# Patient Record
Sex: Female | Born: 1940 | Race: White | Hispanic: No | Marital: Married | Smoking: Never smoker
Health system: Southern US, Community
[De-identification: ages and names within clinical notes are randomized; demographics above are authoritative.]

## PROBLEM LIST (undated history)

## (undated) DIAGNOSIS — I10 Essential (primary) hypertension: Secondary | ICD-10-CM

## (undated) DIAGNOSIS — E119 Type 2 diabetes mellitus without complications: Secondary | ICD-10-CM

## (undated) HISTORY — PX: TUBAL LIGATION: SHX77

## (undated) HISTORY — PX: TRACHEOSTOMY: SUR1362

## (undated) HISTORY — PX: CATARACT EXTRACTION: SUR2

---

## 2019-10-02 ENCOUNTER — Encounter (HOSPITAL_COMMUNITY): Payer: Self-pay | Admitting: Emergency Medicine

## 2019-10-02 ENCOUNTER — Emergency Department (HOSPITAL_COMMUNITY): Payer: Medicare Other

## 2019-10-02 ENCOUNTER — Inpatient Hospital Stay (HOSPITAL_COMMUNITY)
Admission: EM | Admit: 2019-10-02 | Discharge: 2019-10-05 | DRG: 522 | Disposition: A | Discharge status: Skilled Nursing Facility | Payer: Medicare Other | Attending: Internal Medicine | Admitting: Internal Medicine

## 2019-10-02 ENCOUNTER — Other Ambulatory Visit: Payer: Self-pay

## 2019-10-02 DIAGNOSIS — W1830XA Fall on same level, unspecified, initial encounter: Secondary | ICD-10-CM | POA: Diagnosis present

## 2019-10-02 DIAGNOSIS — E1122 Type 2 diabetes mellitus with diabetic chronic kidney disease: Secondary | ICD-10-CM | POA: Diagnosis present

## 2019-10-02 DIAGNOSIS — W19XXXA Unspecified fall, initial encounter: Secondary | ICD-10-CM

## 2019-10-02 DIAGNOSIS — I129 Hypertensive chronic kidney disease with stage 1 through stage 4 chronic kidney disease, or unspecified chronic kidney disease: Secondary | ICD-10-CM | POA: Diagnosis present

## 2019-10-02 DIAGNOSIS — S72009A Fracture of unspecified part of neck of unspecified femur, initial encounter for closed fracture: Secondary | ICD-10-CM | POA: Diagnosis present

## 2019-10-02 DIAGNOSIS — N179 Acute kidney failure, unspecified: Secondary | ICD-10-CM | POA: Diagnosis present

## 2019-10-02 DIAGNOSIS — Y92013 Bedroom of single-family (private) house as the place of occurrence of the external cause: Secondary | ICD-10-CM

## 2019-10-02 DIAGNOSIS — I10 Essential (primary) hypertension: Secondary | ICD-10-CM | POA: Diagnosis present

## 2019-10-02 DIAGNOSIS — D62 Acute posthemorrhagic anemia: Secondary | ICD-10-CM | POA: Diagnosis present

## 2019-10-02 DIAGNOSIS — Z20822 Contact with and (suspected) exposure to covid-19: Secondary | ICD-10-CM | POA: Diagnosis present

## 2019-10-02 DIAGNOSIS — S0990XA Unspecified injury of head, initial encounter: Secondary | ICD-10-CM | POA: Diagnosis present

## 2019-10-02 DIAGNOSIS — G8929 Other chronic pain: Secondary | ICD-10-CM | POA: Diagnosis present

## 2019-10-02 DIAGNOSIS — E1321 Other specified diabetes mellitus with diabetic nephropathy: Secondary | ICD-10-CM

## 2019-10-02 DIAGNOSIS — S72002A Fracture of unspecified part of neck of left femur, initial encounter for closed fracture: Secondary | ICD-10-CM | POA: Diagnosis present

## 2019-10-02 DIAGNOSIS — R339 Retention of urine, unspecified: Secondary | ICD-10-CM | POA: Diagnosis not present

## 2019-10-02 DIAGNOSIS — M549 Dorsalgia, unspecified: Secondary | ICD-10-CM | POA: Diagnosis present

## 2019-10-02 DIAGNOSIS — N183 Chronic kidney disease, stage 3 unspecified: Secondary | ICD-10-CM | POA: Diagnosis present

## 2019-10-02 DIAGNOSIS — Z23 Encounter for immunization: Secondary | ICD-10-CM | POA: Diagnosis present

## 2019-10-02 DIAGNOSIS — Z96642 Presence of left artificial hip joint: Secondary | ICD-10-CM

## 2019-10-02 HISTORY — DX: Essential (primary) hypertension: I10

## 2019-10-02 HISTORY — DX: Type 2 diabetes mellitus without complications: E11.9

## 2019-10-02 LAB — COMPREHENSIVE METABOLIC PANEL
ALT: 15 U/L (ref 0–44)
AST: 18 U/L (ref 15–41)
Albumin: 4.2 g/dL (ref 3.5–5.0)
Alkaline Phosphatase: 58 U/L (ref 38–126)
Anion gap: 11 (ref 5–15)
BUN: 39 mg/dL — ABNORMAL HIGH (ref 8–23)
CO2: 20 mmol/L — ABNORMAL LOW (ref 22–32)
Calcium: 9.1 mg/dL (ref 8.9–10.3)
Chloride: 104 mmol/L (ref 98–111)
Creatinine, Ser: 1.87 mg/dL — ABNORMAL HIGH (ref 0.44–1.00)
GFR calc Af Amer: 30 mL/min — ABNORMAL LOW (ref >60)
GFR calc non Af Amer: 25 mL/min — ABNORMAL LOW (ref >60)
Glucose, Bld: 236 mg/dL — ABNORMAL HIGH (ref 70–99)
Potassium: 4.3 mmol/L (ref 3.5–5.1)
Sodium: 135 mmol/L (ref 135–145)
Total Bilirubin: 0.6 mg/dL (ref 0.3–1.2)
Total Protein: 8.3 g/dL — ABNORMAL HIGH (ref 6.5–8.1)

## 2019-10-02 LAB — CBC WITH DIFFERENTIAL/PLATELET
Abs Immature Granulocytes: 0.05 10*3/uL (ref 0.00–0.07)
Basophils Absolute: 0.1 10*3/uL (ref 0.0–0.1)
Basophils Relative: 1 %
Eosinophils Absolute: 0.2 10*3/uL (ref 0.0–0.5)
Eosinophils Relative: 2 %
HCT: 37.8 % (ref 36.0–46.0)
Hemoglobin: 11.9 g/dL — ABNORMAL LOW (ref 12.0–15.0)
Immature Granulocytes: 0 %
Lymphocytes Relative: 10 %
Lymphs Abs: 1.4 10*3/uL (ref 0.7–4.0)
MCH: 25.5 pg — ABNORMAL LOW (ref 26.0–34.0)
MCHC: 31.5 g/dL (ref 30.0–36.0)
MCV: 81.1 fL (ref 80.0–100.0)
Monocytes Absolute: 0.7 10*3/uL (ref 0.1–1.0)
Monocytes Relative: 5 %
Neutro Abs: 10.8 10*3/uL — ABNORMAL HIGH (ref 1.7–7.7)
Neutrophils Relative %: 82 %
Platelets: 335 10*3/uL (ref 150–400)
RBC: 4.66 MIL/uL (ref 3.87–5.11)
RDW: 15.2 % (ref 11.5–15.5)
WBC: 13.2 10*3/uL — ABNORMAL HIGH (ref 4.0–10.5)
nRBC: 0 % (ref 0.0–0.2)

## 2019-10-02 LAB — SURGICAL PCR SCREEN
MRSA, PCR: NEGATIVE
Staphylococcus aureus: NEGATIVE

## 2019-10-02 LAB — PROTIME-INR
INR: 1 (ref 0.8–1.2)
Prothrombin Time: 12.3 seconds (ref 11.4–15.2)

## 2019-10-02 LAB — SARS CORONAVIRUS 2 BY RT PCR (HOSPITAL ORDER, PERFORMED IN ~~LOC~~ HOSPITAL LAB): SARS Coronavirus 2: NEGATIVE

## 2019-10-02 LAB — HEMOGLOBIN A1C
Hgb A1c MFr Bld: 10.1 % — ABNORMAL HIGH (ref 4.8–5.6)
Mean Plasma Glucose: 243.17 mg/dL

## 2019-10-02 LAB — GLUCOSE, CAPILLARY: Glucose-Capillary: 362 mg/dL — ABNORMAL HIGH (ref 70–99)

## 2019-10-02 LAB — ABO/RH: ABO/RH(D): A NEG

## 2019-10-02 LAB — PREPARE RBC (CROSSMATCH)

## 2019-10-02 MED ORDER — TRAZODONE HCL 50 MG PO TABS
50.0000 mg | ORAL_TABLET | Freq: Every evening | ORAL | Status: DC | PRN
  Administered 2019-10-02: 50 mg via ORAL
  Filled 2019-10-02: qty 1

## 2019-10-02 MED ORDER — ONDANSETRON HCL 4 MG/2ML IJ SOLN
4.0000 mg | Freq: Once | INTRAMUSCULAR | Status: AC
  Administered 2019-10-02: 4 mg via INTRAVENOUS
  Filled 2019-10-02: qty 2

## 2019-10-02 MED ORDER — ONDANSETRON HCL 4 MG PO TABS: 4.0000 mg | ORAL_TABLET | Freq: Four times a day (QID) | ORAL | Status: DC | PRN

## 2019-10-02 MED ORDER — AMLODIPINE BESYLATE 5 MG PO TABS
10.0000 mg | ORAL_TABLET | Freq: Every day | ORAL | Status: DC
  Administered 2019-10-02 – 2019-10-05 (×3): 10 mg via ORAL
  Filled 2019-10-02 (×5): qty 2

## 2019-10-02 MED ORDER — FENTANYL CITRATE (PF) 100 MCG/2ML IJ SOLN
50.0000 ug | INTRAMUSCULAR | Status: DC | PRN
  Administered 2019-10-02 (×3): 50 ug via INTRAVENOUS
  Filled 2019-10-02 (×3): qty 2

## 2019-10-02 MED ORDER — MUPIROCIN 2 % EX OINT
1.0000 "application " | TOPICAL_OINTMENT | Freq: Two times a day (BID) | CUTANEOUS | Status: DC
  Administered 2019-10-02 – 2019-10-05 (×7): 1 via NASAL
  Filled 2019-10-02 (×2): qty 22

## 2019-10-02 MED ORDER — ACETAMINOPHEN 325 MG PO TABS: 650.0000 mg | ORAL_TABLET | Freq: Four times a day (QID) | ORAL | Status: DC | PRN

## 2019-10-02 MED ORDER — FENTANYL CITRATE (PF) 100 MCG/2ML IJ SOLN
50.0000 ug | Freq: Once | INTRAMUSCULAR | Status: AC
  Administered 2019-10-02: 50 ug via INTRAVENOUS
  Filled 2019-10-02: qty 2

## 2019-10-02 MED ORDER — SODIUM CHLORIDE 0.9 % IV SOLN: INTRAVENOUS | Status: DC

## 2019-10-02 MED ORDER — INSULIN ASPART 100 UNIT/ML ~~LOC~~ SOLN
0.0000 [IU] | Freq: Every day | SUBCUTANEOUS | Status: DC
  Administered 2019-10-02: 22:00:00 4 [IU] via SUBCUTANEOUS
  Administered 2019-10-04 – 2019-10-05 (×2): 2 [IU] via SUBCUTANEOUS

## 2019-10-02 MED ORDER — CHLORHEXIDINE GLUCONATE 4 % EX LIQD: 60.0000 mL | Freq: Once | CUTANEOUS | Status: DC

## 2019-10-02 MED ORDER — SODIUM CHLORIDE 0.9% FLUSH: 3.0000 mL | INTRAVENOUS | Status: DC | PRN

## 2019-10-02 MED ORDER — ACETAMINOPHEN 650 MG RE SUPP: 650.0000 mg | Freq: Four times a day (QID) | RECTAL | Status: DC | PRN

## 2019-10-02 MED ORDER — POVIDONE-IODINE 10 % EX SWAB: 2.0000 "application " | Freq: Once | CUTANEOUS | Status: DC

## 2019-10-02 MED ORDER — PNEUMOCOCCAL VAC POLYVALENT 25 MCG/0.5ML IJ INJ
0.5000 mL | INJECTION | INTRAMUSCULAR | Status: AC
  Administered 2019-10-05: 0.5 mL via INTRAMUSCULAR
  Filled 2019-10-02: qty 0.5

## 2019-10-02 MED ORDER — SODIUM CHLORIDE 0.9% FLUSH
3.0000 mL | Freq: Two times a day (BID) | INTRAVENOUS | Status: DC
  Administered 2019-10-03 – 2019-10-05 (×5): 3 mL via INTRAVENOUS

## 2019-10-02 MED ORDER — HEPARIN SODIUM (PORCINE) 5000 UNIT/ML IJ SOLN: 5000.0000 [IU] | Freq: Three times a day (TID) | INTRAMUSCULAR | Status: DC

## 2019-10-02 MED ORDER — SODIUM CHLORIDE 0.9% IV SOLUTION: Freq: Once | INTRAVENOUS | Status: DC

## 2019-10-02 MED ORDER — INSULIN ASPART 100 UNIT/ML ~~LOC~~ SOLN
0.0000 [IU] | Freq: Three times a day (TID) | SUBCUTANEOUS | Status: DC
  Administered 2019-10-03 – 2019-10-04 (×4): 2 [IU] via SUBCUTANEOUS

## 2019-10-02 MED ORDER — ONDANSETRON HCL 4 MG/2ML IJ SOLN
4.0000 mg | Freq: Four times a day (QID) | INTRAMUSCULAR | Status: DC | PRN
  Administered 2019-10-03: 4 mg via INTRAVENOUS
  Filled 2019-10-02: qty 2

## 2019-10-02 MED ORDER — LABETALOL HCL 5 MG/ML IV SOLN
10.0000 mg | INTRAVENOUS | Status: DC | PRN
  Administered 2019-10-03: 10 mg via INTRAVENOUS
  Filled 2019-10-02: qty 4

## 2019-10-02 MED ORDER — METOPROLOL SUCCINATE ER 50 MG PO TB24
100.0000 mg | ORAL_TABLET | Freq: Every day | ORAL | Status: DC
  Administered 2019-10-02 – 2019-10-05 (×2): 100 mg via ORAL
  Filled 2019-10-02 (×4): qty 2

## 2019-10-02 MED ORDER — SODIUM CHLORIDE 0.9 % IV SOLN: 250.0000 mL | INTRAVENOUS | Status: DC | PRN

## 2019-10-02 MED ORDER — CEFAZOLIN SODIUM-DEXTROSE 2-4 GM/100ML-% IV SOLN
2.0000 g | INTRAVENOUS | Status: AC
  Administered 2019-10-03: 2 g via INTRAVENOUS
  Filled 2019-10-02: qty 100

## 2019-10-02 MED ORDER — POLYETHYLENE GLYCOL 3350 17 G PO PACK: 17.0000 g | PACK | Freq: Every day | ORAL | Status: DC | PRN

## 2019-10-02 NOTE — H&P (View-Only) (Signed)
Patient ID: Susan Mcgee, female   DOB: 06/15/1940, 78 y.o.   MRN: [id number] PRE OP   DX LEFT FEMORAL NECK FRACTURE   Procedure: bipolar hemiarthroplasty   Consented: yes   comorbidities: HTN DM  CBC Latest Ref Rng & Units 10/02/2019  WBC 4.0 - 10.5 K/uL 13.2(H)  Hemoglobin 12.0 - 15.0 g/dL 11.9(L)  Hematocrit 36.0 - 46.0 % 37.8  Platelets 150 - 400 K/uL 335    BMP Latest Ref Rng & Units 10/02/2019  Glucose 70 - 99 mg/dL 236(H)  BUN 8 - 23 mg/dL 39(H)  Creatinine 0.44 - 1.00 mg/dL 1.87(H)  Sodium 135 - 145 mmol/L 135  Potassium 3.5 - 5.1 mmol/L 4.3  Chloride 98 - 111 mmol/L 104  CO2 22 - 32 mmol/L 20(L)  Calcium 8.9 - 10.3 mg/dL 9.1   Type and screen : yes   Type and Cross: pending      

## 2019-10-02 NOTE — Progress Notes (Signed)
CSW has reviewed patients chart and notes that patient is without primary care and/or insurance. CSW will follow up with patient regarding this matter and provide assistance as needed.   Bird Tailor M. Henri Baumler LCSWA Transitions of Care  Clinical Social Worker  Ph: [phone] 

## 2019-10-02 NOTE — NC FL2 (Signed)
  [place] MEDICAID FL2 LEVEL OF CARE SCREENING TOOL     IDENTIFICATION  Patient Name: Susan Mcgee Birthdate: 1940-06-17 Sex: female Admission Date (Current Location): 10/02/2019  [facility] and [place] Number:  [employer] and Address:  [facility],  [address], [place] [address]      Provider Number: [id number]  Attending Physician Name and Address:  Shon Hale, MD  Relative Name and Phone Number:  Rebekah Chesterfield [phone]    Current Level of Care: Hospital Recommended Level of Care: Skilled Nursing Facility Prior Approval Number:    Date Approved/Denied: 10/02/19 PASRR Number: [id number]  Discharge Plan: SNF    Current Diagnoses: Patient Active Problem List   Diagnosis Date Noted  . Hip fracture (HCC) 10/02/2019    Orientation RESPIRATION BLADDER Height & Weight     Self, Time, Situation, Place  Normal Continent Weight: 131 lb (59.4 kg) Height:  5\' 3"  (160 cm)  BEHAVIORAL SYMPTOMS/MOOD NEUROLOGICAL BOWEL NUTRITION STATUS      Continent    AMBULATORY STATUS COMMUNICATION OF NEEDS Skin   Extensive Assist Verbally Normal                       Personal Care Assistance Level of Assistance  Bathing, Feeding, Dressing Bathing Assistance: Independent Feeding assistance: Independent Dressing Assistance: Limited assistance     Functional Limitations Info  Sight, Hearing, Speech Sight Info: Adequate Hearing Info: Adequate Speech Info: Adequate    SPECIAL CARE FACTORS FREQUENCY  PT (By licensed PT), OT (By licensed OT)     PT Frequency: 3-5 x weekly OT Frequency: 3-5 x weekly            Contractures Contractures Info: Not present    Additional Factors Info  Code Status, Allergies Code Status Info: Full Allergies Info: No Known Allergies           Current Medications (10/02/2019):  This is the current hospital active medication list Current Facility-Administered Medications  Medication Dose Route  Frequency Provider Last Rate Last Admin  . fentaNYL (SUBLIMAZE) injection 50 mcg  50 mcg Intravenous Q1H PRN Long, 12/02/2019, MD   50 mcg at 10/02/19 1309  . mupirocin ointment (BACTROBAN) 2 % 1 application  1 application Nasal BID Emokpae, Courage, MD      . 12/02/19 ON 10/03/2019] pneumococcal 23 valent vaccine (PNEUMOVAX-23) injection 0.5 mL  0.5 mL Intramuscular Tomorrow-1000 Emokpae, Courage, MD         Discharge Medications: Please see discharge summary for a list of discharge medications.  Relevant Imaging Results:  Relevant Lab Results:   Additional Information 10/05/2019  Susan Mcgee [id number], LCSW

## 2019-10-02 NOTE — H&P (Signed)
Patient Demographics:    Susan Mcgee, is a 79 y.o. female  MRN: [id number]   DOB - 12-13-1940  Admit Date - 10/02/2019  Outpatient Primary MD for the patient is Patient, No Pcp Per   Assessment & Plan:    Principal Problem:   Lt Hip fracture (HCC) Active Problems:   HTN (hypertension)   Diabetes mellitus (HCC)    1)Hip fracture status post fall--- Hip x-rays with mildly displaced and impacted left femoral neck fracture, and possible fracture of the left greater trochanter -Orthopedic consult from Dr. Romeo Apple appreciated, to OR on 10/03/2019 for operative management  -2) AKI versus CKD--creatinine is 1.81, baseline renal function not available, renally adjust medications, avoid nephrotoxic agents / dehydration  / hypotension - 3)HTN--continue Toprol-XL 100 mg daily  4)DM2-A1c pending to determine preadmission control, -Hold Januvia,, Use Novolog/Humalog Sliding scale insulin with Accu-Cheks/Fingersticks as ordered    With History of - Reviewed by me  Past Medical History:  Diagnosis Date  . Diabetes mellitus without complication (HCC)   . Hypertension       Past Surgical History:  Procedure Laterality Date  . CATARACT EXTRACTION    . TRACHEOSTOMY     when she was born  . TUBAL LIGATION        Chief Complaint  Patient presents with  . Fall      HPI:    Susan Mcgee  is a 79 y.o. female who is visiting family here from [place] with a past medical history of HTN, DM2 and chronic back pain who had a mechanical fall at home on 10/01/2019 sustaining left hip fracture  -Patient apparently fell while trying to get into bed, today she is unable to bear weight on the left lower extremity Patient also hit her head on the bedroom furniture, during the fall however no loss of  consciousness -Additional history obtained from patient's granddaughter Candida Peeling -No neuro deficits per se no vomiting no chest pains dizziness palpitations or shortness of breath -  In ED-CT head without acute findings Hip x-rays with mildly displaced and impacted left femoral neck fracture, and possible fracture of the left greater trochanter -Chest x-ray without acute findings -INR is 1.0, WBC 13.2 with H&H of 11.9 and 37.8 with a platelet count of 325 -Glucose is 235, potassium 4.3 creatinine 1.87 no baseline available   Review of systems:    In addition to the HPI above,   A full Review of  Systems was done, all other systems reviewed are negative except as noted above in HPI , .    Social History:  Reviewed by me    Social History   Tobacco Use  . Smoking status: Never Smoker  . Smokeless tobacco: Never Used  Substance Use Topics  . Alcohol use: Never       Family History :  Reviewed by me  HTN   Home Medications:   Prior to Admission medications  Medication Sig Start Date End Date Taking? Authorizing Provider  amLODipine (NORVASC) 10 MG tablet Take 10 mg by mouth daily.   Yes [provider]  metoprolol succinate (TOPROL-XL) 100 MG 24 hr tablet Take 100 mg by mouth daily. Take with or immediately following a meal.   Yes [provider]  sitaGLIPtin (JANUVIA) 50 MG tablet Take 50 mg by mouth daily.   Yes [provider]     Allergies:    No Known Allergies   Physical Exam:   Vitals  Blood pressure 135/77, pulse 63, temperature 98.2 F (36.8 C), temperature source Oral, resp. rate 17, height 5\' 3"  (1.6 m), weight 59.4 kg, SpO2 99 %.  Physical Examination: General appearance - alert, well appearing, and in no distress  Mental status - alert, oriented to person, place, and time,  Eyes - sclera anicteric Neck - supple, no JVD elevation , Chest - clear  to auscultation bilaterally, symmetrical air movement,  Heart -  S1 and S2 normal, regular  Abdomen - soft, nontender, nondistended, no masses or organomegaly Neurological - screening mental status exam normal, neck supple without rigidity, cranial nerves II through XII intact, DTR's normal and symmetric Extremities - no pedal edema noted, intact peripheral pulses --Lt LE is shortened and rotated Skin - warm, dry     Data Review:    CBC Recent Labs  Lab 10/02/19 0831  WBC 13.2*  HGB 11.9*  HCT 37.8  PLT 335  MCV 81.1  MCH 25.5*  MCHC 31.5  RDW 15.2  LYMPHSABS 1.4  MONOABS 0.7  EOSABS 0.2  BASOSABS 0.1   ------------------------------------------------------------------------------------------------------------------  Chemistries  Recent Labs  Lab 10/02/19 0831  NA 135  K 4.3  CL 104  CO2 20*  GLUCOSE 236*  BUN 39*  CREATININE 1.87*  CALCIUM 9.1  AST 18  ALT 15  ALKPHOS 58  BILITOT 0.6   ------------------------------------------------------------------------------------------------------------------ estimated creatinine clearance is 20.8 mL/min (A) (by C-G formula based on SCr of 1.87 mg/dL (H)). ------------------------------------------------------------------------------------------------------------------ No results for input(s): TSH, T4TOTAL, T3FREE, THYROIDAB in the last 72 hours.  Invalid input(s): FREET3   Coagulation profile Recent Labs  Lab 10/02/19 0831  INR 1.0   ------------------------------------------------------------------------------------------------------------------- No results for input(s): DDIMER in the last 72 hours. -------------------------------------------------------------------------------------------------------------------  Cardiac Enzymes No results for input(s): CKMB, TROPONINI, MYOGLOBIN in the last 168 hours.  Invalid input(s): CK ------------------------------------------------------------------------------------------------------------------ No results found for:  BNP   ---------------------------------------------------------------------------------------------------------------  Urinalysis No results found for: COLORURINE, APPEARANCEUR, LABSPEC, PHURINE, GLUCOSEU, HGBUR, BILIRUBINUR, KETONESUR, PROTEINUR, UROBILINOGEN, NITRITE, LEUKOCYTESUR  ----------------------------------------------------------------------------------------------------------------   Imaging Results:    DG Chest 1 View  Result Date: 10/02/2019 CLINICAL DATA:  Fall last night. EXAM: CHEST  1 VIEW COMPARISON:  None. FINDINGS: The heart size and mediastinal contours are within normal limits. Both lungs are clear. The visualized skeletal structures are unremarkable. IMPRESSION: No active disease. Electronically Signed   By: Titus Dubin M.D.   On: 10/02/2019 09:23   CT Head Wo Contrast  Result Date: 10/02/2019 CLINICAL DATA:  Fall last night. EXAM: CT HEAD WITHOUT CONTRAST TECHNIQUE: Contiguous axial images were obtained from the base of the skull through the vertex without intravenous contrast. COMPARISON:  None. FINDINGS: Brain: No evidence of acute infarction, hemorrhage, hydrocephalus, extra-axial collection or mass lesion/mass effect. Small chronic lacunar infarct in the right basal ganglia. Mild-to-moderate generalized cerebral atrophy. Scattered mild periventricular and subcortical white matter hypodensities are nonspecific, but favored to reflect chronic microvascular ischemic changes. Vascular: Atherosclerotic vascular calcification of the  carotid siphons. No hyperdense vessel. Skull: Normal. Negative for fracture or focal lesion. Sinuses/Orbits: No acute finding. Other: None. IMPRESSION: 1. No acute intracranial abnormality. 2. Atrophy and chronic microvascular ischemic changes. 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 Electronically Signed   By: Obie Dredge M.D.   On: 10/02/2019 09:18   DG Hip Unilat W or Wo Pelvis 2-3 Views Left  Result Date:  10/02/2019 CLINICAL DATA:  Left hip pain after fall last night. EXAM: DG HIP (WITH OR WITHOUT PELVIS) 2-3V LEFT COMPARISON:  None. FINDINGS: Acute mildly displaced and impacted fracture of the left femoral neck. Possible acute nondisplaced fracture of the left greater trochanter. No dislocation. The pubic symphysis and sacroiliac joints are intact. Mild bilateral hip degenerative changes. IMPRESSION: 1. Acute mildly displaced and impacted left femoral neck fracture. 2. Possible acute nondisplaced fracture of the left greater trochanter. Electronically Signed   By: Obie Dredge M.D.   On: 10/02/2019 09:22    Radiological Exams on Admission: DG Chest 1 View  Result Date: 10/02/2019 CLINICAL DATA:  Fall last night. EXAM: CHEST  1 VIEW COMPARISON:  None. FINDINGS: The heart size and mediastinal contours are within normal limits. Both lungs are clear. The visualized skeletal structures are unremarkable. IMPRESSION: No active disease. Electronically Signed   By: Obie Dredge M.D.   On: 10/02/2019 09:23   CT Head Wo Contrast  Result Date: 10/02/2019 CLINICAL DATA:  Fall last night. EXAM: CT HEAD WITHOUT CONTRAST TECHNIQUE: Contiguous axial images were obtained from the base of the skull through the vertex without intravenous contrast. COMPARISON:  None. FINDINGS: Brain: No evidence of acute infarction, hemorrhage, hydrocephalus, extra-axial collection or mass lesion/mass effect. Small chronic lacunar infarct in the right basal ganglia. Mild-to-moderate generalized cerebral atrophy. Scattered mild periventricular and subcortical white matter hypodensities are nonspecific, but favored to reflect chronic microvascular ischemic changes. Vascular: Atherosclerotic vascular calcification of the carotid siphons. No hyperdense vessel. Skull: Normal. Negative for fracture or focal lesion. Sinuses/Orbits: No acute finding. Other: None. IMPRESSION: 1. No acute intracranial abnormality. 2. Atrophy and chronic  microvascular ischemic changes. 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 Electronically Signed   By: Obie Dredge M.D.   On: 10/02/2019 09:18   DG Hip Unilat W or Wo Pelvis 2-3 Views Left  Result Date: 10/02/2019 CLINICAL DATA:  Left hip pain after fall last night. EXAM: DG HIP (WITH OR WITHOUT PELVIS) 2-3V LEFT COMPARISON:  None. FINDINGS: Acute mildly displaced and impacted fracture of the left femoral neck. Possible acute nondisplaced fracture of the left greater trochanter. No dislocation. The pubic symphysis and sacroiliac joints are intact. Mild bilateral hip degenerative changes. IMPRESSION: 1. Acute mildly displaced and impacted left femoral neck fracture. 2. Possible acute nondisplaced fracture of the left greater trochanter. Electronically Signed   By: Obie Dredge M.D.   On: 10/02/2019 09:22    DVT Prophylaxis -SCD   AM Labs Ordered, also please review Full Orders  Family Communication: Admission, patients condition and plan of care including tests being ordered have been discussed with the patient and grand-daughter* who indicate understanding and agree with the plan   Code Status - Full Code  Likely DC to  SNF rehab  Condition   stable  Shon Hale M.D on 10/02/2019 at 5:52 PM Go to [email or web address] -  for contact info  [facility] - Office  [phone]

## 2019-10-02 NOTE — Progress Notes (Signed)
Patient ID: Susan Mcgee, female   DOB: 1941/04/12, 79 y.o.   MRN: [id number] PRE OP   DX LEFT FEMORAL NECK FRACTURE   Procedure: bipolar hemiarthroplasty   Consented: yes   comorbidities: HTN DM  CBC Latest Ref Rng & Units 10/02/2019  WBC 4.0 - 10.5 K/uL 13.2(H)  Hemoglobin 12.0 - 15.0 g/dL 11.9(L)  Hematocrit 36.0 - 46.0 % 37.8  Platelets 150 - 400 K/uL 335    BMP Latest Ref Rng & Units 10/02/2019  Glucose 70 - 99 mg/dL 076(K)  BUN 8 - 23 mg/dL 08(U)  Creatinine 1.10 - 1.00 mg/dL 3.15(X)  Sodium 458 - 592 mmol/L 135  Potassium 3.5 - 5.1 mmol/L 4.3  Chloride 98 - 111 mmol/L 104  CO2 22 - 32 mmol/L 20(L)  Calcium 8.9 - 10.3 mg/dL 9.1   Type and screen : yes   Type and Cross: pending

## 2019-10-02 NOTE — ED Provider Notes (Signed)
Emergency Department Provider Note   I have reviewed the triage vital signs and the nursing notes.   HISTORY  Chief Complaint Fall   HPI Susan Mcgee is a 79 y.o. female with PMH of DM and HTN presents to the emergency department for evaluation of left hip pain after fall yesterday.  Patient was trying to get into her bed when she fell off the edge and landed on the floor.  She was unable to stand on her own and family had to assist her back into bed.  She stayed there through the night but this morning was unable to put any weight on the left leg and EMS was ultimately called.  When patient fell she states she did strike her head along a bedside piece of furniture and did not lose consciousness.  She is now experiencing pain in her neck.  She has chronic pain in her back that is not worse.  No numbness or weakness in the arms or legs.  No vision changes or speech difficulty. No CP or SOB. No knee or ankle pain. No pain in the arms/shoulders.     Past Medical History:  Diagnosis Date  . Diabetes mellitus without complication (HCC)   . Hypertension     There are no problems to display for this patient.   Past Surgical History:  Procedure Laterality Date  . CATARACT EXTRACTION    . TRACHEOSTOMY     when she was born  . TUBAL LIGATION      Allergies Patient has no allergy information on record.  No family history on file.  Social History Social History   Tobacco Use  . Smoking status: Never Smoker  . Smokeless tobacco: Never Used  Substance Use Topics  . Alcohol use: Never  . Drug use: Never    Review of Systems  Constitutional: No fever/chills Eyes: No visual changes. ENT: No sore throat. Cardiovascular: Denies chest pain. Respiratory: Denies shortness of breath. Gastrointestinal: No abdominal pain.  No nausea, no vomiting.  No diarrhea.  No constipation. Genitourinary: Negative for dysuria. Musculoskeletal: Chronic lower back pain. Positive left hip  pain after fall.  Skin: Negative for rash. Neurological: Negative for focal weakness or numbness. Mild HA.   10-point ROS otherwise negative.  ____________________________________________   PHYSICAL EXAM:  VITAL SIGNS: ED Triage Vitals  Enc Vitals Group     BP 10/02/19 0748 (!) 181/70     Pulse Rate 10/02/19 0748 71     Resp 10/02/19 0748 16     Temp 10/02/19 0748 98.3 F (36.8 C)     Temp Source 10/02/19 0748 Oral     SpO2 10/02/19 0748 97 %     Weight 10/02/19 0749 131 lb (59.4 kg)     Height 10/02/19 0749 5\' 3"  (1.6 m)   Constitutional: Alert and oriented. Well appearing and in no acute distress. Eyes: Conjunctivae are normal. PERRL. Head: Atraumatic. Nose: No congestion/rhinnorhea. Mouth/Throat: Mucous membranes are moist.  Neck: No stridor. No cervical spine tenderness to palpation. Cardiovascular: Normal rate, regular rhythm. Good peripheral circulation. Grossly normal heart sounds. Intact DP/PT pulses on the left.  Respiratory: Normal respiratory effort.  No retractions. Lungs CTAB. Gastrointestinal: Soft and nontender. No distention.  Musculoskeletal: LLE with shortened and externally rotated.  No tenderness over the left ankle or knee. Normal ROM and exam of the RLE and bilateral upper extremities.  Neurologic:  Normal speech and language. No gross focal neurologic deficits are appreciated.  Skin:  Skin is  warm, dry and intact. No rash noted.   ____________________________________________   LABS (all labs ordered are listed, but only abnormal results are displayed)  Labs Reviewed  COMPREHENSIVE METABOLIC PANEL - Abnormal; Notable for the following components:      Result Value   CO2 20 (*)    Glucose, Bld 236 (*)    BUN 39 (*)    Creatinine, Ser 1.87 (*)    Total Protein 8.3 (*)    GFR calc non Af Amer 25 (*)    GFR calc Af Amer 30 (*)    All other components within normal limits  CBC WITH DIFFERENTIAL/PLATELET - Abnormal; Notable for the following  components:   WBC 13.2 (*)    Hemoglobin 11.9 (*)    MCH 25.5 (*)    Neutro Abs 10.8 (*)    All other components within normal limits  SARS CORONAVIRUS 2 BY RT PCR (HOSPITAL ORDER, [place] [facility])  PROTIME-INR   ____________________________________________  EKG   EKG Interpretation  Date/Time:  Wednesday Oct 02 2019 09:41:07 EDT Ventricular Rate:  66 PR Interval:    QRS Duration: 93 QT Interval:  397 QTC Calculation: 416 R Axis:   -10 Text Interpretation: Sinus rhythm Short PR interval Anteroseptal infarct, age indeterminate No STEMI Confirmed by Nanda Quinton [phone]) on 10/02/2019 9:49:56 AM       ____________________________________________  RADIOLOGY  DG Chest 1 View  Result Date: 10/02/2019 CLINICAL DATA:  Fall last night. EXAM: CHEST  1 VIEW COMPARISON:  None. FINDINGS: The heart size and mediastinal contours are within normal limits. Both lungs are clear. The visualized skeletal structures are unremarkable. IMPRESSION: No active disease. Electronically Signed   By: Titus Dubin M.D.   On: 10/02/2019 09:23   CT Head Wo Contrast  Result Date: 10/02/2019 CLINICAL DATA:  Fall last night. EXAM: CT HEAD WITHOUT CONTRAST TECHNIQUE: Contiguous axial images were obtained from the base of the skull through the vertex without intravenous contrast. COMPARISON:  None. FINDINGS: Brain: No evidence of acute infarction, hemorrhage, hydrocephalus, extra-axial collection or mass lesion/mass effect. Small chronic lacunar infarct in the right basal ganglia. Mild-to-moderate generalized cerebral atrophy. Scattered mild periventricular and subcortical white matter hypodensities are nonspecific, but favored to reflect chronic microvascular ischemic changes. Vascular: Atherosclerotic vascular calcification of the carotid siphons. No hyperdense vessel. Skull: Normal. Negative for fracture or focal lesion. Sinuses/Orbits: No acute finding. Other: None. IMPRESSION: 1. No acute  intracranial abnormality. 2. Atrophy and chronic microvascular ischemic changes. 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 Electronically Signed   By: Titus Dubin M.D.   On: 10/02/2019 09:18   DG Hip Unilat W or Wo Pelvis 2-3 Views Left  Result Date: 10/02/2019 CLINICAL DATA:  Left hip pain after fall last night. EXAM: DG HIP (WITH OR WITHOUT PELVIS) 2-3V LEFT COMPARISON:  None. FINDINGS: Acute mildly displaced and impacted fracture of the left femoral neck. Possible acute nondisplaced fracture of the left greater trochanter. No dislocation. The pubic symphysis and sacroiliac joints are intact. Mild bilateral hip degenerative changes. IMPRESSION: 1. Acute mildly displaced and impacted left femoral neck fracture. 2. Possible acute nondisplaced fracture of the left greater trochanter. Electronically Signed   By: Titus Dubin M.D.   On: 10/02/2019 09:22    ____________________________________________   PROCEDURES  Procedure(s) performed:   Procedures  None  ____________________________________________   INITIAL IMPRESSION / ASSESSMENT AND PLAN / ED COURSE  Pertinent labs & imaging results that were available during my care  of the patient were reviewed by me and considered in my medical decision making (see chart for details).   Patient presents emergency department for evaluation after mechanical fall with left hip pain.  There is some shortening and external rotation on exam.  Suspect left hip fracture clinically.  Plan for plain film of the left hip, chest x-ray, CT head.  If positive for fracture we will add on Covid test and screening blood work. Patient is not anticoagulated. Last PO was last night.   09:44 AM  Spoke w/ Dr. Romeo Apple who will consult in the ED. Will admit to [facility]. No firm surgery timeline as of yet. Will keep NPO.   10:25 AM  Discussed patient's case with [facility], Dr. Mariea Clonts to request admission. Patient and family (if present) updated with plan. Care  transferred to [facility] service.  I reviewed all nursing notes, vitals, pertinent old records, EKGs, labs, imaging (as available).  ____________________________________________  FINAL CLINICAL IMPRESSION(S) / ED DIAGNOSES  Final diagnoses:  Closed fracture of left hip, initial encounter (HCC)  Fall, initial encounter  Injury of head, initial encounter    MEDICATIONS GIVEN DURING THIS VISIT:  Medications  fentaNYL (SUBLIMAZE) injection 50 mcg (50 mcg Intravenous Given 10/02/19 0957)  fentaNYL (SUBLIMAZE) injection 50 mcg (50 mcg Intravenous Given 10/02/19 0834)  ondansetron (ZOFRAN) injection 4 mg (4 mg Intravenous Given 10/02/19 5809)    Note:  This document was prepared using Dragon voice recognition software and may include unintentional dictation errors.  Alona Bene, MD, [facility] Emergency [facility]    Lamarkus Nebel, Arlyss Repress, MD 10/02/19 1025

## 2019-10-02 NOTE — Consult Note (Signed)
Reason for Consult: Left hip fracture Referring Physician: Dr. Marisa Severin is the attending  Susan Mcgee is an 79 y.o. female.  HPI: 79 year old female here with her daughter says she was trying to get up into bed her leg gave way and she fell injuring her left hip this corresponds with the history noted from the ER report noted below under HPI  Date of injury Oct 01, 2019 Location of pain left hip Severity 10 Radiation none  HPI Susan Mcgee is a 79 y.o. female with PMH of DM and HTN presents to the emergency department for evaluation of left hip pain after fall yesterday.  Patient was trying to get into her bed when she fell off the edge and landed on the floor.  She was unable to stand on her own and family had to assist her back into bed.  She stayed there through the night but this morning was unable to put any weight on the left leg and EMS was ultimately called.  When patient fell she states she did strike her head along a bedside piece of furniture and did not lose consciousness.  She is now experiencing pain in her neck.  She has chronic pain in her back that is not worse.  No numbness or weakness in the arms or legs.  No vision changes or speech difficulty. No CP or SOB. No knee or ankle pain. No pain in the arms/shoulders.           Past Medical History:  Diagnosis Date  . Diabetes mellitus without complication (HCC)    . Hypertension       Past Medical History:  Diagnosis Date  . Diabetes mellitus without complication (HCC)   . Hypertension     Past Surgical History:  Procedure Laterality Date  . CATARACT EXTRACTION    . TRACHEOSTOMY     when she was born  . TUBAL LIGATION      No family history on file.  No notable anesthesia risks or problems  Social History:  reports that she has never smoked. She has never used smokeless tobacco. She reports that she does not drink alcohol or use drugs.  Allergies: No Known Allergies   Results for orders placed or  performed during the hospital encounter of 10/02/19 (from the past 48 hour(s))  Comprehensive metabolic panel     Status: Abnormal   Collection Time: 10/02/19  8:31 AM  Result Value Ref Range   Sodium 135 135 - 145 mmol/L   Potassium 4.3 3.5 - 5.1 mmol/L   Chloride 104 98 - 111 mmol/L   CO2 20 (L) 22 - 32 mmol/L   Glucose, Bld 236 (H) 70 - 99 mg/dL    Comment: Glucose reference range applies only to samples taken after fasting for at least 8 hours.   BUN 39 (H) 8 - 23 mg/dL   Creatinine, Ser 8.46 (H) 0.44 - 1.00 mg/dL   Calcium 9.1 8.9 - 65.9 mg/dL   Total Protein 8.3 (H) 6.5 - 8.1 g/dL   Albumin 4.2 3.5 - 5.0 g/dL   AST 18 15 - 41 U/L   ALT 15 0 - 44 U/L   Alkaline Phosphatase 58 38 - 126 U/L   Total Bilirubin 0.6 0.3 - 1.2 mg/dL   GFR calc non Af Amer 25 (L) >60 mL/min   GFR calc Af Amer 30 (L) >60 mL/min   Anion gap 11 5 - 15    Comment: Performed at [facility], [address] [place], [place] [address]  CBC with Differential     Status: Abnormal   Collection Time: 10/02/19  8:31 AM  Result Value Ref Range   WBC 13.2 (H) 4.0 - 10.5 K/uL   RBC 4.66 3.87 - 5.11 MIL/uL   Hemoglobin 11.9 (L) 12.0 - 15.0 g/dL   HCT 37.8 36.0 - 46.0 %   MCV 81.1 80.0 - 100.0 fL   MCH 25.5 (L) 26.0 - 34.0 pg   MCHC 31.5 30.0 - 36.0 g/dL   RDW 15.2 11.5 - 15.5 %   Platelets 335 150 - 400 K/uL   nRBC 0.0 0.0 - 0.2 %   Neutrophils Relative % 82 %   Neutro Abs 10.8 (H) 1.7 - 7.7 K/uL   Lymphocytes Relative 10 %   Lymphs Abs 1.4 0.7 - 4.0 K/uL   Monocytes Relative 5 %   Monocytes Absolute 0.7 0.1 - 1.0 K/uL   Eosinophils Relative 2 %   Eosinophils Absolute 0.2 0.0 - 0.5 K/uL   Basophils Relative 1 %   Basophils Absolute 0.1 0.0 - 0.1 K/uL   Immature Granulocytes 0 %   Abs Immature Granulocytes 0.05 0.00 - 0.07 K/uL    Comment: Performed at [facility], [address], [place], [place] [address]  Protime-INR     Status: None   Collection Time: 10/02/19  8:31 AM  Result Value Ref Range    Prothrombin Time 12.3 11.4 - 15.2 seconds   INR 1.0 0.8 - 1.2    Comment: (NOTE) INR goal varies based on device and disease states. Performed at [facility], [address] [place], [place] [address]   SARS Coronavirus 2 by RT PCR (hospital order, performed in [facility] hospital lab) Nasopharyngeal Nasopharyngeal Swab     Status: None   Collection Time: 10/02/19 10:02 AM   Specimen: Nasopharyngeal Swab  Result Value Ref Range   SARS Coronavirus 2 NEGATIVE NEGATIVE    Comment: (NOTE) SARS-CoV-2 target nucleic acids are NOT DETECTED. The SARS-CoV-2 RNA is generally detectable in upper and lower respiratory specimens during the acute phase of infection. The lowest concentration of SARS-CoV-2 viral copies this assay can detect is 250 copies / mL. A negative result does not preclude SARS-CoV-2 infection and should not be used as the sole basis for treatment or other patient management decisions.  A negative result may occur with improper specimen collection / handling, submission of specimen other than nasopharyngeal swab, presence of viral mutation(s) within the areas targeted by this assay, and inadequate number of viral copies (<250 copies / mL). A negative result must be combined with clinical observations, patient history, and epidemiological information. Fact Sheet for Patients:   StrictlyIdeas.no Fact Sheet for Healthcare Providers: BankingDealers.co.za This test is not yet approved or cleared  by the [place] FDA and has been authorized for detection and/or diagnosis of SARS-CoV-2 by FDA under an Emergency Use Authorization (EUA).  This EUA will remain in effect (meaning this test can be used) for the duration of the COVID-19 declaration under Section 564(b)(1) of the Act, 21 U.S.C. section 360bbb-3(b)(1), unless the authorization is terminated or revoked sooner. Performed at [facility], [address] [place],   [address]     DG Chest 1 View  Result Date: 10/02/2019 CLINICAL DATA:  Fall last night. EXAM: CHEST  1 VIEW COMPARISON:  None. FINDINGS: The heart size and mediastinal contours are within normal limits. Both lungs are clear. The visualized skeletal structures are unremarkable. IMPRESSION: No active  disease. Electronically Signed   By: Obie Dredge M.D.   On: 10/02/2019 09:23   CT Head Wo Contrast  Result Date: 10/02/2019 CLINICAL DATA:  Fall last night. EXAM: CT HEAD WITHOUT CONTRAST TECHNIQUE: Contiguous axial images were obtained from the base of the skull through the vertex without intravenous contrast. COMPARISON:  None. FINDINGS: Brain: No evidence of acute infarction, hemorrhage, hydrocephalus, extra-axial collection or mass lesion/mass effect. Small chronic lacunar infarct in the right basal ganglia. Mild-to-moderate generalized cerebral atrophy. Scattered mild periventricular and subcortical white matter hypodensities are nonspecific, but favored to reflect chronic microvascular ischemic changes. Vascular: Atherosclerotic vascular calcification of the carotid siphons. No hyperdense vessel. Skull: Normal. Negative for fracture or focal lesion. Sinuses/Orbits: No acute finding. Other: None. IMPRESSION: 1. No acute intracranial abnormality. 2. Atrophy and chronic microvascular ischemic changes. 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 Electronically Signed   By: Obie Dredge M.D.   On: 10/02/2019 09:18   DG Hip Unilat W or Wo Pelvis 2-3 Views Left  Result Date: 10/02/2019 CLINICAL DATA:  Left hip pain after fall last night. EXAM: DG HIP (WITH OR WITHOUT PELVIS) 2-3V LEFT COMPARISON:  None. FINDINGS: Acute mildly displaced and impacted fracture of the left femoral neck. Possible acute nondisplaced fracture of the left greater trochanter. No dislocation. The pubic symphysis and sacroiliac joints are intact. Mild bilateral hip degenerative changes. IMPRESSION: 1. Acute mildly displaced and  impacted left femoral neck fracture. 2. Possible acute nondisplaced fracture of the left greater trochanter. Electronically Signed   By: Obie Dredge M.D.   On: 10/02/2019 09:22    Review of Systems Blood pressure 135/77, pulse 63, temperature 98.2 F (36.8 C), temperature source Oral, resp. rate 17, height 5\' 3"  (1.6 m), weight 59.4 kg, SpO2 99 %. Physical Exam   Normal development grooming and hygiene normal body habitus  No gross deformities  Awake alert oriented person place and time Mood normal affect normal  Gait unable to walk  Upper extremity coordination normal  Neurovascular exam intact x4  Upper extremities no contracture subluxation atrophy or tenderness skin is normal  Right lower extremity no contracture subluxation atrophy or tenderness skin is normal  Left lower extremity is tender over the proximal portion of the thigh and hip range of motion was deferred stability test in the hip deferred knee and ankle normal.  Skin normal.  Knee and ankle stable hip not tested because of fracture  Assessment/Plan:  Left femoral neck fracture  Personal image interpretation displaced left femoral neck fracture no evidence of greater troches fracture   79 year old female with left femoral neck fracture.  Recommend bipolar replacement.  Patient has not had any hip pain seems to be pretty active just moved from 63 was taking care of her sick husband   The procedure has been fully reviewed with the patient; The risks and benefits of surgery have been discussed and explained and understood. Alternative treatment has also been reviewed, questions were encouraged and answered. The postoperative plan is also been reviewed.   [place] 10/02/2019, 3:57 PM

## 2019-10-02 NOTE — ED Triage Notes (Signed)
Pt fell in the floor last night while getting into the bed.  Woke up this morning with pain to left leg. Left leg shortened and rotated.

## 2019-10-02 NOTE — ED Notes (Signed)
ED Provider at bedside. 

## 2019-10-03 ENCOUNTER — Encounter (HOSPITAL_COMMUNITY)
Admission: EM | Disposition: A | Discharge status: Skilled Nursing Facility | Payer: Self-pay | Source: Home / Self Care | Attending: Family Medicine

## 2019-10-03 ENCOUNTER — Inpatient Hospital Stay (HOSPITAL_COMMUNITY): Payer: Medicare Other

## 2019-10-03 ENCOUNTER — Inpatient Hospital Stay (HOSPITAL_COMMUNITY): Payer: Medicare Other | Admitting: Anesthesiology

## 2019-10-03 ENCOUNTER — Encounter (HOSPITAL_COMMUNITY): Payer: Self-pay | Admitting: Family Medicine

## 2019-10-03 DIAGNOSIS — S72002A Fracture of unspecified part of neck of left femur, initial encounter for closed fracture: Principal | ICD-10-CM

## 2019-10-03 HISTORY — PX: HIP ARTHROPLASTY: SHX981

## 2019-10-03 LAB — BASIC METABOLIC PANEL
Anion gap: 11 (ref 5–15)
BUN: 38 mg/dL — ABNORMAL HIGH (ref 8–23)
CO2: 22 mmol/L (ref 22–32)
Calcium: 9 mg/dL (ref 8.9–10.3)
Chloride: 102 mmol/L (ref 98–111)
Creatinine, Ser: 1.92 mg/dL — ABNORMAL HIGH (ref 0.44–1.00)
GFR calc Af Amer: 29 mL/min — ABNORMAL LOW (ref >60)
GFR calc non Af Amer: 25 mL/min — ABNORMAL LOW (ref >60)
Glucose, Bld: 284 mg/dL — ABNORMAL HIGH (ref 70–99)
Potassium: 4.6 mmol/L (ref 3.5–5.1)
Sodium: 135 mmol/L (ref 135–145)

## 2019-10-03 LAB — CBC
HCT: 36.4 % (ref 36.0–46.0)
Hemoglobin: 11.5 g/dL — ABNORMAL LOW (ref 12.0–15.0)
MCH: 25.6 pg — ABNORMAL LOW (ref 26.0–34.0)
MCHC: 31.6 g/dL (ref 30.0–36.0)
MCV: 80.9 fL (ref 80.0–100.0)
Platelets: 306 10*3/uL (ref 150–400)
RBC: 4.5 MIL/uL (ref 3.87–5.11)
RDW: 15.2 % (ref 11.5–15.5)
WBC: 10.1 10*3/uL (ref 4.0–10.5)
nRBC: 0 % (ref 0.0–0.2)

## 2019-10-03 LAB — GLUCOSE, CAPILLARY
Glucose-Capillary: 237 mg/dL — ABNORMAL HIGH (ref 70–99)
Glucose-Capillary: 271 mg/dL — ABNORMAL HIGH (ref 70–99)
Glucose-Capillary: 277 mg/dL — ABNORMAL HIGH (ref 70–99)
Glucose-Capillary: 261 mg/dL — ABNORMAL HIGH (ref 70–99)
Glucose-Capillary: 290 mg/dL — ABNORMAL HIGH (ref 70–99)

## 2019-10-03 SURGERY — HEMIARTHROPLASTY, HIP, DIRECT ANTERIOR APPROACH, FOR FRACTURE
Anesthesia: Monitor Anesthesia Care | Site: Hip | Laterality: Left

## 2019-10-03 MED ORDER — MIDAZOLAM HCL 2 MG/2ML IJ SOLN
1.0000 mg | Freq: Once | INTRAMUSCULAR | Status: AC
  Administered 2019-10-03: 1 mg via INTRAVENOUS

## 2019-10-03 MED ORDER — GLIMEPIRIDE 2 MG PO TABS
2.0000 mg | ORAL_TABLET | Freq: Every day | ORAL | Status: DC
  Administered 2019-10-05: 2 mg via ORAL
  Filled 2019-10-03 (×2): qty 1

## 2019-10-03 MED ORDER — BUPIVACAINE-EPINEPHRINE (PF) 0.5% -1:200000 IJ SOLN
INTRAMUSCULAR | Status: AC
  Filled 2019-10-03: qty 30

## 2019-10-03 MED ORDER — FENTANYL CITRATE (PF) 100 MCG/2ML IJ SOLN
INTRAMUSCULAR | Status: AC
  Filled 2019-10-03: qty 2

## 2019-10-03 MED ORDER — CHLORHEXIDINE GLUCONATE CLOTH 2 % EX PADS
6.0000 | MEDICATED_PAD | Freq: Every day | CUTANEOUS | Status: DC
  Administered 2019-10-04: 6 via TOPICAL

## 2019-10-03 MED ORDER — MENTHOL 3 MG MT LOZG: 1.0000 | LOZENGE | OROMUCOSAL | Status: DC | PRN

## 2019-10-03 MED ORDER — FENTANYL CITRATE (PF) 100 MCG/2ML IJ SOLN
INTRAMUSCULAR | Status: DC | PRN
  Administered 2019-10-03: 10 ug via INTRATHECAL

## 2019-10-03 MED ORDER — SODIUM CHLORIDE 0.9 % IR SOLN
Status: DC | PRN
  Administered 2019-10-03: 3000 mL

## 2019-10-03 MED ORDER — ACETAMINOPHEN 500 MG PO TABS: 500.0000 mg | ORAL_TABLET | Freq: Once | ORAL | Status: DC

## 2019-10-03 MED ORDER — TRAMADOL HCL 50 MG PO TABS
50.0000 mg | ORAL_TABLET | Freq: Four times a day (QID) | ORAL | Status: DC
  Administered 2019-10-03 – 2019-10-05 (×8): 50 mg via ORAL
  Filled 2019-10-03 (×8): qty 1

## 2019-10-03 MED ORDER — CEFAZOLIN SODIUM-DEXTROSE 2-4 GM/100ML-% IV SOLN
2.0000 g | Freq: Four times a day (QID) | INTRAVENOUS | Status: AC
  Administered 2019-10-03 – 2019-10-04 (×2): 2 g via INTRAVENOUS
  Filled 2019-10-03 (×2): qty 100

## 2019-10-03 MED ORDER — DEXAMETHASONE SODIUM PHOSPHATE 4 MG/ML IJ SOLN
INTRAMUSCULAR | Status: AC
  Filled 2019-10-03: qty 1

## 2019-10-03 MED ORDER — BUPIVACAINE LIPOSOME 1.3 % IJ SUSP
INTRAMUSCULAR | Status: AC
  Filled 2019-10-03: qty 20

## 2019-10-03 MED ORDER — PROPOFOL 500 MG/50ML IV EMUL
INTRAVENOUS | Status: DC | PRN
  Administered 2019-10-03: 25 ug/kg/min via INTRAVENOUS

## 2019-10-03 MED ORDER — LACTATED RINGERS IV SOLN: Freq: Once | INTRAVENOUS | Status: AC

## 2019-10-03 MED ORDER — LINAGLIPTIN 5 MG PO TABS
5.0000 mg | ORAL_TABLET | Freq: Every day | ORAL | Status: DC
  Administered 2019-10-03 – 2019-10-05 (×2): 5 mg via ORAL
  Filled 2019-10-03 (×3): qty 1

## 2019-10-03 MED ORDER — METHOCARBAMOL 1000 MG/10ML IJ SOLN
500.0000 mg | Freq: Four times a day (QID) | INTRAVENOUS | Status: DC | PRN
  Filled 2019-10-03: qty 5

## 2019-10-03 MED ORDER — DEXAMETHASONE SODIUM PHOSPHATE 4 MG/ML IJ SOLN
INTRAMUSCULAR | Status: AC
  Filled 2019-10-03: qty 2

## 2019-10-03 MED ORDER — SENNOSIDES-DOCUSATE SODIUM 8.6-50 MG PO TABS: 1.0000 | ORAL_TABLET | Freq: Every evening | ORAL | Status: DC | PRN

## 2019-10-03 MED ORDER — ONDANSETRON HCL 4 MG PO TABS: 4.0000 mg | ORAL_TABLET | Freq: Four times a day (QID) | ORAL | Status: DC | PRN

## 2019-10-03 MED ORDER — 0.9 % SODIUM CHLORIDE (POUR BTL) OPTIME
TOPICAL | Status: DC | PRN
  Administered 2019-10-03: 1000 mL

## 2019-10-03 MED ORDER — ONDANSETRON HCL 4 MG/2ML IJ SOLN: 4.0000 mg | Freq: Once | INTRAMUSCULAR | Status: DC | PRN

## 2019-10-03 MED ORDER — BUPIVACAINE-EPINEPHRINE (PF) 0.5% -1:200000 IJ SOLN
INTRAMUSCULAR | Status: DC | PRN
  Administered 2019-10-03: 15 mL via PERINEURAL

## 2019-10-03 MED ORDER — PROPOFOL 10 MG/ML IV BOLUS
INTRAVENOUS | Status: AC
  Filled 2019-10-03: qty 40

## 2019-10-03 MED ORDER — LIDOCAINE HCL (PF) 1 % IJ SOLN
INTRAMUSCULAR | Status: AC
  Filled 2019-10-03: qty 2

## 2019-10-03 MED ORDER — METOCLOPRAMIDE HCL 10 MG PO TABS: 5.0000 mg | ORAL_TABLET | Freq: Three times a day (TID) | ORAL | Status: DC | PRN

## 2019-10-03 MED ORDER — BUPIVACAINE HCL (PF) 0.5 % IJ SOLN
INTRAMUSCULAR | Status: AC
  Filled 2019-10-03: qty 30

## 2019-10-03 MED ORDER — ONDANSETRON HCL 4 MG/2ML IJ SOLN: 4.0000 mg | Freq: Four times a day (QID) | INTRAMUSCULAR | Status: DC | PRN

## 2019-10-03 MED ORDER — BUPIVACAINE HCL (PF) 0.25 % IJ SOLN
INTRAMUSCULAR | Status: AC
  Filled 2019-10-03: qty 30

## 2019-10-03 MED ORDER — PHENOL 1.4 % MT LIQD: 1.0000 | OROMUCOSAL | Status: DC | PRN

## 2019-10-03 MED ORDER — EPHEDRINE 5 MG/ML INJ
INTRAVENOUS | Status: AC
  Filled 2019-10-03: qty 10

## 2019-10-03 MED ORDER — KETAMINE HCL 50 MG/5ML IJ SOSY
PREFILLED_SYRINGE | INTRAMUSCULAR | Status: AC
  Filled 2019-10-03: qty 5

## 2019-10-03 MED ORDER — MIDAZOLAM HCL 2 MG/2ML IJ SOLN
INTRAMUSCULAR | Status: AC
  Filled 2019-10-03: qty 2

## 2019-10-03 MED ORDER — DEXAMETHASONE SODIUM PHOSPHATE 10 MG/ML IJ SOLN
INTRAMUSCULAR | Status: AC
  Filled 2019-10-03: qty 1

## 2019-10-03 MED ORDER — KETAMINE HCL 10 MG/ML IJ SOLN
INTRAMUSCULAR | Status: DC | PRN
  Administered 2019-10-03 (×2): 10 mg via INTRAVENOUS

## 2019-10-03 MED ORDER — ASPIRIN EC 325 MG PO TBEC
325.0000 mg | DELAYED_RELEASE_TABLET | Freq: Every day | ORAL | Status: DC
  Administered 2019-10-05: 325 mg via ORAL
  Filled 2019-10-03 (×2): qty 1

## 2019-10-03 MED ORDER — FENTANYL CITRATE (PF) 100 MCG/2ML IJ SOLN
25.0000 ug | INTRAMUSCULAR | Status: DC | PRN
  Administered 2019-10-03 – 2019-10-04 (×2): 25 ug via INTRAVENOUS
  Filled 2019-10-03 (×2): qty 2

## 2019-10-03 MED ORDER — HYDROMORPHONE HCL 1 MG/ML IJ SOLN: 0.2500 mg | INTRAMUSCULAR | Status: DC | PRN

## 2019-10-03 MED ORDER — DOCUSATE SODIUM 100 MG PO CAPS
100.0000 mg | ORAL_CAPSULE | Freq: Two times a day (BID) | ORAL | Status: DC
  Administered 2019-10-03: 100 mg via ORAL
  Filled 2019-10-03 (×2): qty 1

## 2019-10-03 MED ORDER — BUPIVACAINE HCL (PF) 0.5 % IJ SOLN
INTRAMUSCULAR | Status: DC | PRN
Start: 2019-10-03 — End: 2019-10-03
  Administered 2019-10-03: 1.6 mL via INTRATHECAL

## 2019-10-03 MED ORDER — LACTATED RINGERS IV SOLN
INTRAVENOUS | Status: DC | PRN
Start: 2019-10-03 — End: 2019-10-03

## 2019-10-03 MED ORDER — EPHEDRINE SULFATE 50 MG/ML IJ SOLN
INTRAMUSCULAR | Status: DC | PRN
  Administered 2019-10-03 (×4): 5 mg via INTRAVENOUS

## 2019-10-03 MED ORDER — METOCLOPRAMIDE HCL 5 MG/ML IJ SOLN
5.0000 mg | Freq: Three times a day (TID) | INTRAMUSCULAR | Status: DC | PRN
  Administered 2019-10-04: 10 mg via INTRAVENOUS
  Filled 2019-10-03: qty 2

## 2019-10-03 MED ORDER — METHOCARBAMOL 500 MG PO TABS: 500.0000 mg | ORAL_TABLET | Freq: Four times a day (QID) | ORAL | Status: DC | PRN

## 2019-10-03 SURGICAL SUPPLY — 65 items
BIPOLAR PROS AML 44 (Hips) ×2 IMPLANT
BIPOLAR PROS AML 44MM (Hips) ×1 IMPLANT
BIT DRILL 2.8X128 (BIT) ×2 IMPLANT
BIT DRILL 2.8X128MM (BIT) ×1
BLADE HEX COATED 2.75 (ELECTRODE) ×3 IMPLANT
BLADE SAGITTAL 25.0X1.27X90 (BLADE) ×2 IMPLANT
BLADE SAGITTAL 25.0X1.27X90MM (BLADE) ×1
BLADE SURG SZ10 CARB STEEL (BLADE) ×3 IMPLANT
BRUSH FEMORAL CANAL (MISCELLANEOUS) IMPLANT
CHLORAPREP W/TINT 26 (MISCELLANEOUS) ×3 IMPLANT
CLOTH BEACON ORANGE TIMEOUT ST (SAFETY) ×3 IMPLANT
COVER LIGHT HANDLE STERIS (MISCELLANEOUS) ×6 IMPLANT
COVER WAND RF STERILE (DRAPES) ×3 IMPLANT
DECANTER SPIKE VIAL GLASS SM (MISCELLANEOUS) ×3 IMPLANT
DRAPE HIP W/POCKET STRL (MISCELLANEOUS) ×3 IMPLANT
DRAPE U-SHAPE 47X51 STRL (DRAPES) ×3 IMPLANT
DRSG MEPILEX BORDER 4X12 (GAUZE/BANDAGES/DRESSINGS) ×3 IMPLANT
DRSG MEPILEX SACRM 8.7X9.8 (GAUZE/BANDAGES/DRESSINGS) ×3 IMPLANT
ELECT REM PT RETURN 9FT ADLT (ELECTROSURGICAL) ×3
ELECTRODE REM PT RTRN 9FT ADLT (ELECTROSURGICAL) ×1 IMPLANT
GLOVE BIO SURGEON STRL SZ7 (GLOVE) ×4 IMPLANT
GLOVE BIOGEL PI IND STRL 7.0 (GLOVE) ×3 IMPLANT
GLOVE BIOGEL PI INDICATOR 7.0 (GLOVE) ×6
GLOVE SKINSENSE NS SZ8.0 LF (GLOVE) ×4
GLOVE SKINSENSE STRL SZ8.0 LF (GLOVE) ×2 IMPLANT
GLOVE SS N UNI LF 8.5 STRL (GLOVE) ×3 IMPLANT
GOWN STRL REUS W/TWL LRG LVL3 (GOWN DISPOSABLE) ×6 IMPLANT
GOWN STRL REUS W/TWL XL LVL3 (GOWN DISPOSABLE) ×3 IMPLANT
HANDPIECE INTERPULSE COAX TIP (DISPOSABLE) ×2
HEAD BIPOLAR PROS AML 44 (Hips) IMPLANT
HEAD FEM STD 28X+1.5 STRL (Hips) ×2 IMPLANT
INST SET MAJOR BONE (KITS) ×3 IMPLANT
KIT BLADEGUARD II DBL (SET/KITS/TRAYS/PACK) ×3 IMPLANT
KIT TURNOVER KIT A (KITS) ×3 IMPLANT
MANIFOLD NEPTUNE II (INSTRUMENTS) ×3 IMPLANT
MARKER SKIN DUAL TIP RULER LAB (MISCELLANEOUS) ×3 IMPLANT
NDL HYPO 18GX1.5 BLUNT FILL (NEEDLE) ×1 IMPLANT
NDL HYPO 21X1.5 SAFETY (NEEDLE) ×1 IMPLANT
NEEDLE HYPO 18GX1.5 BLUNT FILL (NEEDLE) ×3 IMPLANT
NEEDLE HYPO 21X1.5 SAFETY (NEEDLE) ×3 IMPLANT
NS IRRIG 1000ML POUR BTL (IV SOLUTION) ×3 IMPLANT
PACK TOTAL JOINT (CUSTOM PROCEDURE TRAY) ×2 IMPLANT
PAD ARMBOARD 7.5X6 YLW CONV (MISCELLANEOUS) ×3 IMPLANT
PASSER SUT SWANSON 36MM LOOP (INSTRUMENTS) IMPLANT
PENCIL SMOKE EVACUATOR (MISCELLANEOUS) ×2 IMPLANT
PIN STMN SNGL STERILE 9X3.6MM (PIN) ×6 IMPLANT
SET BASIN LINEN APH (SET/KITS/TRAYS/PACK) ×3 IMPLANT
SET HNDPC FAN SPRY TIP SCT (DISPOSABLE) ×1 IMPLANT
STAPLER VISISTAT 35W (STAPLE) ×3 IMPLANT
STEM SUMMIT BASIC PRESSFIT SZ3 (Hips) IMPLANT
SUMMIT BASIC PRESSFIT SZ3 (Hips) ×3 IMPLANT
SUT BRALON NAB BRD #1 30IN (SUTURE) ×8 IMPLANT
SUT ETHIBOND 5 LR DA (SUTURE) ×6 IMPLANT
SUT MNCRL 0 VIOLET CTX 36 (SUTURE) ×1 IMPLANT
SUT MON AB 2-0 CT1 36 (SUTURE) ×3 IMPLANT
SUT MONOCRYL 0 CTX 36 (SUTURE) ×2
SUT VIC AB 1 CT1 27 (SUTURE) ×6
SUT VIC AB 1 CT1 27XBRD ANTBC (SUTURE) ×2 IMPLANT
SYR 20ML LL LF (SYRINGE) ×6 IMPLANT
SYR 30ML LL (SYRINGE) ×3 IMPLANT
SYR BULB IRRIG 60ML STRL (SYRINGE) ×3 IMPLANT
TOWER CARTRIDGE SMART MIX (DISPOSABLE) IMPLANT
TRAY FOLEY MTR SLVR 16FR STAT (SET/KITS/TRAYS/PACK) ×3 IMPLANT
WATER STERILE IRR 1000ML POUR (IV SOLUTION) ×6 IMPLANT
YANKAUER SUCT 12FT TUBE ARGYLE (SUCTIONS) ×3 IMPLANT

## 2019-10-03 NOTE — TOC Initial Note (Signed)
Transition of Care [facility]) - Initial/Assessment Note    Patient Details  Name: Susan Mcgee MRN: [id number] Date of Birth: 06-30-1940  [facility] of [facility]) CM/SW Contact:    Elliot Gault, LCSW Phone Number: 10/03/2019, 11:02 AM  Clinical Narrative:                  Pt admitted from her granddaughter's home with hip fx. Pt's husband is also hospitalized here at [facility]. Pt and her husband have been staying with their granddaughter here in [place]. Pt and her husband live in [place] where they are established with a PCP.   Plan is for surgery tomorrow and dc to [facility] when stable.   TOC will follow and assist with dc planning as needed.  Expected Discharge Plan: Skilled Nursing Facility Barriers to Discharge: Continued Medical Work up   Patient Goals and CMS Choice Patient states their goals for this hospitalization and ongoing recovery are:: rehab and go home CMS Medicare.gov Compare Post Acute Care list provided to:: Patient Represenative (must comment) Choice offered to / list presented to : Adult Children  Expected Discharge Plan and Services Expected Discharge Plan: Skilled Nursing Facility In-house Referral: Clinical Social Work     Living arrangements for the past 2 months: Skilled Nursing Facility                                      Prior Living Arrangements/Services Living arrangements for the past 2 months: Skilled Nursing Facility Lives with:: Adult Children Patient language and need for interpreter reviewed:: Yes Do you feel safe going back to the place where you live?: Yes      Need for Family Participation in Patient Care: Yes (Comment) Care giver support system in place?: Yes (comment)   Criminal Activity/Legal Involvement Pertinent to Current Situation/Hospitalization: No - Comment as needed  Activities of Daily Living Home Assistive Devices/Equipment: Dentures (specify type) ADL Screening (condition at time of  admission) Patient's cognitive ability adequate to safely complete daily activities?: Yes Is the patient deaf or have difficulty hearing?: No Does the patient have difficulty seeing, even when wearing glasses/contacts?: No Does the patient have difficulty concentrating, remembering, or making decisions?: No Patient able to express need for assistance with ADLs?: Yes Does the patient have difficulty dressing or bathing?: No Independently performs ADLs?: Yes (appropriate for developmental age) Does the patient have difficulty walking or climbing stairs?: No Weakness of Legs: None Weakness of Arms/Hands: None  Permission Sought/Granted Permission sought to share information with : Facility Industrial/product designer granted to share information with : Yes, Verbal Permission Granted     Permission granted to share info w AGENCY: [facility]        Emotional Assessment       Orientation: : Oriented to Self, Oriented to Place, Oriented to  Time, Oriented to Situation Alcohol / Substance Use: Not Applicable Psych Involvement: No (comment)  Admission diagnosis:  Hip fracture (HCC) [S72.009A] Fall [W19.XXXA] Closed fracture of left hip, initial encounter (HCC) [S72.002A] Injury of head, initial encounter [S09.90XA] Fall, initial encounter [W19.XXXA] Patient Active Problem List   Diagnosis Date Noted  . Lt Hip fracture (HCC) 10/02/2019  . HTN (hypertension) 10/02/2019  . Diabetes mellitus (HCC) 10/02/2019   PCP:  Patient, No Pcp Per Pharmacy:   [id number] - [place], [place] - [address] [place] [address] Phone: [phone] Fax: [phone]     Social Determinants of Health (SDOH) Interventions    Readmission Risk Interventions No flowsheet data found.

## 2019-10-03 NOTE — Anesthesia Procedure Notes (Signed)
Anesthesia Regional Block: Fascia iliaca block   Pre-Anesthetic Checklist: ,, timeout performed, Correct Patient, Correct Site, Correct Laterality, Correct Procedure, Correct Position, site marked, Risks and benefits discussed, at surgeon's request and post-op pain management  Laterality: Left  Prep: chloraprep       Needles:  Injection technique: Single-shot  Needle Type: Echogenic Stimulator Needle     Needle Length: 10cm  Needle Gauge: 20   Needle insertion depth: 6 cm   Additional Needles:   Procedures:,,,, ultrasound used (permanent image in chart),,,,  Narrative:  Start time: 10/03/2019 12:29 PM End time: 10/03/2019 12:35 PM Injection made incrementally with aspirations every 5 mL.  Performed by: Personally  Anesthesiologist: Molli Barrows, MD  Additional Notes: BP cuff, EKG monitors applied. Sedation begun. After nerve location anesthetic injected incrementally, slowly , and after neg aspirations. Tolerated well.

## 2019-10-03 NOTE — Anesthesia Preprocedure Evaluation (Signed)
Anesthesia Evaluation  Patient identified by MRN, date of birth, ID band Patient awake    Reviewed: Allergy & Precautions, NPO status , Patient's Chart, lab work & pertinent test results, reviewed documented beta blocker date and time   History of Anesthesia Complications Negative for: history of anesthetic complications  Airway Mallampati: II  TM Distance: >3 FB Neck ROM: Full    Dental  (+) Upper Dentures, Dental Advisory Given   Pulmonary neg pulmonary ROS,    Pulmonary exam normal breath sounds clear to auscultation       Cardiovascular Exercise Tolerance: Good hypertension, Pt. on medications and Pt. on home beta blockers Normal cardiovascular exam Rhythm:Regular Rate:Normal  02-Oct-2019 09:41:07 [facility]-ER ROUTINE RECORD Sinus rhythm Short PR interval Anteroseptal infarct, age indeterminate No STEMI Confirmed by Nanda Quinton [phone]) on 10/02/2019 9:49:56 AM   Neuro/Psych negative neurological ROS  negative psych ROS   GI/Hepatic negative GI ROS, Neg liver ROS,   Endo/Other  diabetes, Well Controlled, Type 2, Oral Hypoglycemic Agents  Renal/GU      Musculoskeletal   Abdominal   Peds  Hematology   Anesthesia Other Findings   Reproductive/Obstetrics                             Anesthesia Physical Anesthesia Plan  ASA: II  Anesthesia Plan: Spinal and MAC   Post-op Pain Management:    Induction:   PONV Risk Score and Plan: 2  Airway Management Planned: Nasal Cannula, Natural Airway and Simple Face Mask  Additional Equipment:   Intra-op Plan:   Post-operative Plan:   Informed Consent: I have reviewed the patients History and Physical, chart, labs and discussed the procedure including the risks, benefits and alternatives for the proposed anesthesia with the patient or authorized representative who has indicated his/her understanding and acceptance.      Dental advisory given  Plan Discussed with: CRNA and Surgeon  Anesthesia Plan Comments: (Possible GA with airway was discussed.)        Anesthesia Quick Evaluation

## 2019-10-03 NOTE — Progress Notes (Signed)
Inpatient Diabetes Program Recommendations  AACE/ADA: New Consensus Statement on Inpatient Glycemic Control (2015)  Target Ranges:  Prepandial:   less than 140 mg/dL      Peak postprandial:   less than 180 mg/dL (1-2 hours)      Critically ill patients:  140 - 180 mg/dL   Lab Results  Component Value Date   GLUCAP 271 (H) 10/03/2019   HGBA1C 10.1 (H) 10/02/2019    Review of Glycemic Control Results for GLORI, MACHNIK (MRN [id number]) as of 10/03/2019 12:11  Ref. Range 10/02/2019 20:43 10/03/2019 07:46 10/03/2019 11:04  Glucose-Capillary Latest Ref Range: 70 - 99 mg/dL 707 (H) 867 (H) 544 (H)   Diabetes history: Type 2 DM Outpatient Diabetes medications: Januvia 50 mg QD Current orders for Inpatient glycemic control: Novolog 0-4 units QHS, Novolog 0-5 units TID  Inpatient Diabetes Program Recommendations:    Noted A1C.  Consider adding Levemir 8 units QD and change custom scale to start at 200 mg/dL.  Attempted to call patient, in OR. Will attempt when appropriate.   Thanks, Lujean Rave, MSN, RNC-OB Diabetes Coordinator [phone] (8a-5p)

## 2019-10-03 NOTE — Anesthesia Procedure Notes (Signed)
Spinal  Patient location during procedure: OR Start time: 10/03/2019 12:58 PM End time: 10/03/2019 1:04 PM Staffing Performed: anesthesiologist  Anesthesiologist: Molli Barrows, MD Preanesthetic Checklist Completed: patient identified, IV checked, site marked, risks and benefits discussed, surgical consent, monitors and equipment checked, pre-op evaluation and timeout performed Spinal Block Patient position: right lateral decubitus Prep: ChloraPrep and DuraPrep Patient monitoring: heart rate, cardiac monitor, continuous pulse ox and blood pressure Approach: midline Location: L3-4 Injection technique: single-shot Needle Needle type: Sprotte  Needle gauge: 24 G Needle length: 10 cm Needle insertion depth: 5 cm Assessment Sensory level: T8

## 2019-10-03 NOTE — Op Note (Signed)
10/03/2019  4:34 PM  PATIENT:  Susan Mcgee  79 y.o. female  PRE-OPERATIVE DIAGNOSIS:  left hip fracture  POST-OPERATIVE DIAGNOSIS:  left hip fracture  PROCEDURE:  Procedure(s): ARTHROPLASTY BIPOLAR HIP (HEMIARTHROPLASTY) (Left)   IMPLANTS:  [place] [place] basic size 3 femur Size 1.5 inner head for neck length Size 44 outer diameter head  Direct lateral approach    SURGEON:  Surgeon(s) and Role:    Vickki Hearing, MD - Primary  PHYSICIAN ASSISTANT:   ASSISTANTS: Canary Brim  ANESTHESIA: Spinal anesthesia with ileal fascial block    EBL:  100 mL   BLOOD ADMINISTERED:none  DRAINS: none   LOCAL MEDICATIONS USED:  MARCAINE     SPECIMEN:  No Specimen  DISPOSITION OF SPECIMEN:  N/A  COUNTS:  YES  TOURNIQUET:  * No tourniquets in log *  DICTATION: .Dragon Dictation  PLAN OF CARE: Admit to inpatient   PATIENT DISPOSITION:  PACU - hemodynamically stable.   Delay start of Pharmacological VTE agent (>24hrs) due to surgical blood loss or risk of bleeding: yes  Dictation for bipolar prosthesis  The patient was seen in preop where she had an ileal fascial block surgical site was confirmed as left hip. Chart review was completed I checked the implants for availability x-rays were reviewed. She was taken to the operating room for spinal anesthesia she was turned on her right side in the left lateral decubitus position with an axillary roll. She was prepped and draped sterilely and timeout was completed. The incision was made over the greater trochanter of the left tibia was divided down to fascia. After retractors were placed and bleeding was controlled with electrocautery the fascia was split in line with the skin incision. A greater trochanteric bursectomy was performed and it was noted that she had had steroid injections in this area. After the bursectomy we removed from the greater trochanter of the gluteus medius tendon and retracted approximately we preserve  the gluteus minimus tendon splitting it in half and retracting it anteriorly and posteriorly. This included the capsule. The fracture was noted and the hip was dislocated and the head was removed. The head measured 44 mm in diameter.  The proximal femur was prepared per manufacturer at recommendations and serial broaching's were performed up to a size 3 trial 1.5 neck length with a 44 outer diameter head was placed and a trial reduction was performed. Shuck test was good abductor tension was good knee flexion was 120 degrees hip flexion 125 degrees extension external rotation 5 degrees / 45 degrees sleep position stable, the reduction proved stable and the trial components were removed drill holes were placed in the greater trochanter and a #5 Ethibond suture was passed. After thorough irrigation the implants were placed and the hip was reduced. I was able to reproduce the trial reduction and proceeded to close the abductors with the #5 Ethibond suture oversewing it with a #1 Braylon. A second irrigation was performed 15 cc of Marcaine was injected and the hip was placed in abduction the fascia was closed with a running #1 Braylon suture  The subcutaneous tissue was closed with 0 Monocryl in interrupted and then running fashion reapproximating the skin edges with staples  The patient was taken back to the regular bed leg lengths were again checked and found to be equal. Patient was taken recovery room in stable condition  [id number]  Postop plan Weightbearing status as tolerated Staples come out postop day 14 24 hours of antibiotics Direct lateral  hip precautions DVT prophylaxis for 30 days

## 2019-10-03 NOTE — Brief Op Note (Signed)
10/03/2019  4:34 PM  PATIENT:  Susan Mcgee  79 y.o. female  PRE-OPERATIVE DIAGNOSIS:  left hip fracture  POST-OPERATIVE DIAGNOSIS:  left hip fracture  PROCEDURE:  Procedure(s): ARTHROPLASTY BIPOLAR HIP (HEMIARTHROPLASTY) (Left)   IMPLANTS:  DePuy [place] basic size 3 femur Size 1.5 inner head for neck length Size 44 outer diameter head  Direct lateral approach    SURGEON:  Surgeon(s) and Role:    Vickki Hearing, MD - Primary  PHYSICIAN ASSISTANT:   ASSISTANTS: Canary Brim  ANESTHESIA: Spinal anesthesia with ileal fascial block    EBL:  100 mL   BLOOD ADMINISTERED:none  DRAINS: none   LOCAL MEDICATIONS USED:  MARCAINE     SPECIMEN:  No Specimen  DISPOSITION OF SPECIMEN:  N/A  COUNTS:  YES  TOURNIQUET:  * No tourniquets in log *  DICTATION: .Dragon Dictation  PLAN OF CARE: Admit to inpatient   PATIENT DISPOSITION:  PACU - hemodynamically stable.   Delay start of Pharmacological VTE agent (>24hrs) due to surgical blood loss or risk of bleeding: yes  Dictation for bipolar prosthesis  The patient was seen in preop where she had an ileal fascial block surgical site was confirmed as left hip. Chart review was completed I checked the implants for availability x-rays were reviewed. She was taken to the operating room for spinal anesthesia she was turned on her right side in the left lateral decubitus position with an axillary roll. She was prepped and draped sterilely and timeout was completed. The incision was made over the greater trochanter of the left tibia was divided down to fascia. After retractors were placed and bleeding was controlled with electrocautery the fascia was split in line with the skin incision. A greater trochanteric bursectomy was performed and it was noted that she had had steroid injections in this area. After the bursectomy we removed from the greater trochanter of the gluteus medius tendon and retracted approximately we preserve  the gluteus minimus tendon splitting it in half and retracting it anteriorly and posteriorly. This included the capsule. The fracture was noted and the hip was dislocated and the head was removed. The head measured 44 mm in diameter.  The proximal femur was prepared per manufacturer at recommendations and serial broaching's were performed up to a size 3 trial 1.5 neck length with a 44 outer diameter head was placed and a trial reduction was performed. Shuck test was good abductor tension was good knee flexion was 120 degrees hip flexion 125 degrees extension external rotation 5 degrees / 45 degrees sleep position stable, the reduction proved stable and the trial components were removed drill holes were placed in the greater trochanter and a #5 Ethibond suture was passed. After thorough irrigation the implants were placed and the hip was reduced. I was able to reproduce the trial reduction and proceeded to close the abductors with the #5 Ethibond suture oversewing it with a #1 Braylon. A second irrigation was performed 15 cc of Marcaine was injected and the hip was placed in abduction the fascia was closed with a running #1 Braylon suture  The subcutaneous tissue was closed with 0 Monocryl in interrupted and then running fashion reapproximating the skin edges with staples  The patient was taken back to the regular bed leg lengths were again checked and found to be equal. Patient was taken recovery room in stable condition  [id number]  Postop plan Weightbearing status as tolerated Staples come out postop day 14 24 hours of antibiotics Direct lateral  hip precautions DVT prophylaxis for 30 days

## 2019-10-03 NOTE — Progress Notes (Signed)
Patient Demographics:    Susan Mcgee, is a 79 y.o. female, DOB - 02/23/1941, MPN:[id number]  Admit date - 10/02/2019   Admitting Physician Ronnett Pullin Denton Brick, MD  Outpatient Primary MD for the patient is Patient, No Pcp Per  LOS - 1   Chief Complaint  Patient presents with  . Fall        Subjective:    Susan Mcgee today has no fevers, no emesis,  No chest pain,   --Tolerated surgery well -Granddaughter at bedside  Assessment  & Plan :    Principal Problem:   Lt Hip fracture ([place]) Active Problems:   HTN (hypertension)   Diabetes mellitus ([place])  Brief Summary:- 79 y.o. female who is visiting family here from [place] with a past medical history of HTN, DM2 and chronic back pain who had a mechanical fall at home on 10/01/2019 sustaining left hip fracture  -Status post ORIF on 10/03/2019 now awaiting SNF placement   A/p 1)Lt Hip Fx- s/p Left ARTHROPLASTY BIPOLAR HIP (HEMIARTHROPLASTY) on 10/03/19 -Further management per orthopedic team  2)AKI versus CKD--creatinine is up to 1.92 from 1.81 on admission, baseline renal function unknown, renally adjust medications, avoid nephrotoxic agents / dehydration  / hypotension  3)HTN--continue Toprol-XL 100 mg daily and amlodipine 10 mg daily along with as needed labetalol  4)DM2-A1c 10.1 reflecting uncontrolled DM PTA  Use Novolog/Humalog Sliding scale insulin with Accu-Cheks/Fingersticks as ordered Restart Januvia and add Amaryl 2.5 mg q. breakfast as patient is postop and no longer n.p.o.  Disposition/Need for in-Hospital Stay- patient unable to be discharged at this time due to --status post left hip surgery awaiting SNF placement -Patient From: home D/C Place: SNF Barriers: Not Clinically Stable-   Code Status :  full  Family Communication:   NA (patient is alert, awake and coherent)  Consults  :  ortho  DVT Prophylaxis  :     Heparin - SCDs   Lab Results  Component Value Date   PLT 306 10/03/2019    Inpatient Medications  Scheduled Meds: . sodium chloride   Intravenous Once  . acetaminophen  500 mg Oral Once  . amLODipine  10 mg Oral Daily  . [START ON 10/04/2019] Chlorhexidine Gluconate Cloth  6 each Topical Q0600  . [START ON 10/04/2019] heparin  5,000 Units Subcutaneous Q8H  . insulin aspart  0-4 Units Subcutaneous QHS  . insulin aspart  0-5 Units Subcutaneous TID WC  . metoprolol succinate  100 mg Oral Daily  . mupirocin ointment  1 application Nasal BID  . pneumococcal 23 valent vaccine  0.5 mL Intramuscular Tomorrow-1000  . sodium chloride flush  3 mL Intravenous Q12H  . traMADol  50 mg Oral Q6H   Continuous Infusions: . sodium chloride    . sodium chloride 40 mL/hr at 10/03/19 1642   PRN Meds:.sodium chloride, acetaminophen **OR** acetaminophen, fentaNYL (SUBLIMAZE) injection, labetalol, ondansetron **OR** ondansetron (ZOFRAN) IV, polyethylene glycol, sodium chloride flush, traZODone    Anti-infectives (From admission, onward)   Start     Dose/Rate Route Frequency Ordered Stop   10/03/19 0600  ceFAZolin (ANCEF) IVPB 2g/100 mL premix     2 g 200 mL/hr over 30 Minutes Intravenous On call to O.R. 10/02/19 1841 10/03/19 1317  Objective:   Vitals:   10/03/19 1530 10/03/19 1554 10/03/19 1625 10/03/19 1725  BP: (!) 150/61 133/66 (!) 156/64 (!) 137/92  Pulse: 60 64 60 66  Resp: 16 18 18 18   Temp:  98.2 F (36.8 C) 97.6 F (36.4 C) 97.6 F (36.4 C)  TempSrc:   Axillary Axillary  SpO2: 98% 98% 98% 98%  Weight:      Height:        Wt Readings from Last 3 Encounters:  10/02/19 59.4 kg     Intake/Output Summary (Last 24 hours) at 10/03/2019 1841 Last data filed at 10/03/2019 1424 Gross per 24 hour  Intake 340 ml  Output 1450 ml  Net -1110 ml    Physical Exam  Physical Examination: General appearance - alert, well appearing, and in no distress  Mental status - alert,  oriented to person, place, and time,  Eyes - sclera anicteric Neck - supple, no JVD elevation , Chest - clear  to auscultation bilaterally, symmetrical air movement,  Heart - S1 and S2 normal, regular  Abdomen - soft, nontender, nondistended,  Neurological - screening mental status exam normal, neck supple without rigidity, cranial nerves II through XII intact, DTR's normal and symmetric Extremities - no pedal edema noted, intact peripheral pulses --Lt hip postop wound noted  skin - warm, dry   Data Review:   Micro Results Recent Results (from the past 240 hour(s))  SARS Coronavirus 2 by RT PCR (hospital order, performed in [facility] hospital lab) Nasopharyngeal Nasopharyngeal Swab     Status: None   Collection Time: 10/02/19 10:02 AM   Specimen: Nasopharyngeal Swab  Result Value Ref Range Status   SARS Coronavirus 2 NEGATIVE NEGATIVE Final    Comment: (NOTE) SARS-CoV-2 target nucleic acids are NOT DETECTED. The SARS-CoV-2 RNA is generally detectable in upper and lower respiratory specimens during the acute phase of infection. The lowest concentration of SARS-CoV-2 viral copies this assay can detect is 250 copies / mL. A negative result does not preclude SARS-CoV-2 infection and should not be used as the sole basis for treatment or other patient management decisions.  A negative result may occur with improper specimen collection / handling, submission of specimen other than nasopharyngeal swab, presence of viral mutation(s) within the areas targeted by this assay, and inadequate number of viral copies (<250 copies / mL). A negative result must be combined with clinical observations, patient history, and epidemiological information. Fact Sheet for Patients:   12/02/19 Fact Sheet for Healthcare Providers: BoilerBrush.com.cy This test is not yet approved or cleared  by the https://pope.com/ FDA and has been authorized for  detection and/or diagnosis of SARS-CoV-2 by FDA under an Emergency Use Authorization (EUA).  This EUA will remain in effect (meaning this test can be used) for the duration of the COVID-19 declaration under Section 564(b)(1) of the Act, 21 U.S.C. section 360bbb-3(b)(1), unless the authorization is terminated or revoked sooner. Performed at [facility], [address] [place], [place]   Surgical PCR screen     Status: None   Collection Time: 10/02/19 12:15 PM   Specimen: Nasal Mucosa; Nasal Swab  Result Value Ref Range Status   MRSA, PCR NEGATIVE NEGATIVE Final   Staphylococcus aureus NEGATIVE NEGATIVE Final    Comment: (NOTE) The Xpert SA Assay (FDA approved for NASAL specimens in patients 57 years of age and older), is one component of a comprehensive surveillance program. It is not intended to diagnose infection nor to guide or monitor treatment.  Performed at [facility], [address] [place], [place] [address]     Radiology Reports DG Chest 1 View  Result Date: 10/02/2019 CLINICAL DATA:  Fall last night. EXAM: CHEST  1 VIEW COMPARISON:  None. FINDINGS: The heart size and mediastinal contours are within normal limits. Both lungs are clear. The visualized skeletal structures are unremarkable. IMPRESSION: No active disease. Electronically Signed   By: Obie Dredge M.D.   On: 10/02/2019 09:23   CT Head Wo Contrast  Result Date: 10/02/2019 CLINICAL DATA:  Fall last night. EXAM: CT HEAD WITHOUT CONTRAST TECHNIQUE: Contiguous axial images were obtained from the base of the skull through the vertex without intravenous contrast. COMPARISON:  None. FINDINGS: Brain: No evidence of acute infarction, hemorrhage, hydrocephalus, extra-axial collection or mass lesion/mass effect. Small chronic lacunar infarct in the right basal ganglia. Mild-to-moderate generalized cerebral atrophy. Scattered mild periventricular and subcortical white matter hypodensities are nonspecific, but  favored to reflect chronic microvascular ischemic changes. Vascular: Atherosclerotic vascular calcification of the carotid siphons. No hyperdense vessel. Skull: Normal. Negative for fracture or focal lesion. Sinuses/Orbits: No acute finding. Other: None. IMPRESSION: 1. No acute intracranial abnormality. 2. Atrophy and chronic microvascular ischemic changes. 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 Electronically Signed   By: Obie Dredge M.D.   On: 10/02/2019 09:18   DG Pelvis Portable  Result Date: 10/03/2019 CLINICAL DATA:  Status post left hip replacement EXAM: PORTABLE PELVIS 1-2 VIEWS COMPARISON:  10/02/2019 FINDINGS: Left hip prosthesis is noted in satisfactory position. No acute soft tissue or bony abnormality is seen. Pelvic ring is intact. IMPRESSION: Status post left hip replacement. Electronically Signed   By: Alcide Clever M.D.   On: 10/03/2019 15:40   DG Hip Unilat W or Wo Pelvis 2-3 Views Left  Result Date: 10/02/2019 CLINICAL DATA:  Left hip pain after fall last night. EXAM: DG HIP (WITH OR WITHOUT PELVIS) 2-3V LEFT COMPARISON:  None. FINDINGS: Acute mildly displaced and impacted fracture of the left femoral neck. Possible acute nondisplaced fracture of the left greater trochanter. No dislocation. The pubic symphysis and sacroiliac joints are intact. Mild bilateral hip degenerative changes. IMPRESSION: 1. Acute mildly displaced and impacted left femoral neck fracture. 2. Possible acute nondisplaced fracture of the left greater trochanter. Electronically Signed   By: Obie Dredge M.D.   On: 10/02/2019 09:22     CBC Recent Labs  Lab 10/02/19 0831 10/03/19 0628  WBC 13.2* 10.1  HGB 11.9* 11.5*  HCT 37.8 36.4  PLT 335 306  MCV 81.1 80.9  MCH 25.5* 25.6*  MCHC 31.5 31.6  RDW 15.2 15.2  LYMPHSABS 1.4  --   MONOABS 0.7  --   EOSABS 0.2  --   BASOSABS 0.1  --     Chemistries  Recent Labs  Lab 10/02/19 0831 10/03/19 0628  NA 135 135  K 4.3 4.6  CL 104 102  CO2  20* 22  GLUCOSE 236* 284*  BUN 39* 38*  CREATININE 1.87* 1.92*  CALCIUM 9.1 9.0  AST 18  --   ALT 15  --   ALKPHOS 58  --   BILITOT 0.6  --    ------------------------------------------------------------------------------------------------------------------ No results for input(s): CHOL, HDL, LDLCALC, TRIG, CHOLHDL, LDLDIRECT in the last 72 hours.  Lab Results  Component Value Date   HGBA1C 10.1 (H) 10/02/2019   ------------------------------------------------------------------------------------------------------------------ No results for input(s): TSH, T4TOTAL, T3FREE, THYROIDAB in the last 72 hours.  Invalid input(s): FREET3 ------------------------------------------------------------------------------------------------------------------ No results for input(s): VITAMINB12, FOLATE,  FERRITIN, TIBC, IRON, RETICCTPCT in the last 72 hours.  Coagulation profile Recent Labs  Lab 10/02/19 0831  INR 1.0    No results for input(s): DDIMER in the last 72 hours.  Cardiac Enzymes No results for input(s): CKMB, TROPONINI, MYOGLOBIN in the last 168 hours.  Invalid input(s): CK ------------------------------------------------------------------------------------------------------------------ No results found for: BNP   Shon Hale M.D on 10/03/2019 at 6:41 PM  Go to [email or web address] - for contact info  [facility] - Office  [phone]

## 2019-10-03 NOTE — Interval H&P Note (Signed)
History and Physical Interval Note:  10/03/2019 12:20 PM  Susan Mcgee  has presented today for surgery, with the diagnosis of left hip fracture.  The various methods of treatment have been discussed with the patient and family. After consideration of risks, benefits and other options for treatment, the patient has consented to  Procedure(s): ARTHROPLASTY BIPOLAR HIP (HEMIARTHROPLASTY) (Left) as a surgical intervention.  The patient's history has been reviewed, patient examined, no change in status, stable for surgery.  I have reviewed the patient's chart and labs.  Questions were answered to the patient's satisfaction.     Fuller Canada

## 2019-10-03 NOTE — Transfer of Care (Signed)
Immediate Anesthesia Transfer of Care Note  Patient: Susan Mcgee  Procedure(s) Performed: ARTHROPLASTY BIPOLAR HIP (HEMIARTHROPLASTY) (Left Hip)  Patient Location: PACU  Anesthesia Type:Regional  Level of Consciousness: awake, alert  and oriented  Airway & Oxygen Therapy: Patient Spontanous Breathing  Post-op Assessment: Report given to RN and Post -op Vital signs reviewed and stable  Post vital signs: Reviewed and stable  Last Vitals:  Vitals Value Taken Time  BP 119/72 10/03/19 1432  Temp    Pulse 71 10/03/19 1433  Resp 20 10/03/19 1433  SpO2 97 % 10/03/19 1433  Vitals shown include unvalidated device data.  Last Pain:  Vitals:   10/03/19 1106  TempSrc: Oral  PainSc: 0-No pain         Complications: No apparent anesthesia complications

## 2019-10-03 NOTE — Interval H&P Note (Signed)
History and Physical Interval Note:  10/03/2019 12:18 PM  Susan Mcgee  has presented today for surgery, with the diagnosis of left hip fracture.  The various methods of treatment have been discussed with the patient and family. After consideration of risks, benefits and other options for treatment, the patient has consented to  Procedure(s): ARTHROPLASTY BIPOLAR HIP (HEMIARTHROPLASTY) (Left) as a surgical intervention.  The patient's history has been reviewed, patient examined, no change in status, stable for surgery.  I have reviewed the patient's chart and labs.  Questions were answered to the patient's satisfaction.     Fuller Canada

## 2019-10-03 NOTE — Brief Op Note (Signed)
10/03/2019  2:31 PM  PATIENT:  Susan Mcgee  79 y.o. female  PRE-OPERATIVE DIAGNOSIS:  left hip fracture  POST-OPERATIVE DIAGNOSIS:  left hip fracture  PROCEDURE:  Procedure(s): ARTHROPLASTY BIPOLAR HIP (HEMIARTHROPLASTY) (Left)   IMPLANTS:  DePuy [place] basic size 3 femur Size 1.5 inner head for neck length Size 44 outer diameter head  Direct lateral approach    SURGEON:  Surgeon(s) and Role:    Carole Civil, MD - Primary  PHYSICIAN ASSISTANT:   ASSISTANTS: Fulton Mole  ANESTHESIA: Spinal anesthesia with ileal fascial block    EBL:  100 mL   BLOOD ADMINISTERED:none  DRAINS: none   LOCAL MEDICATIONS USED:  MARCAINE     SPECIMEN:  No Specimen  DISPOSITION OF SPECIMEN:  N/A  COUNTS:  YES  TOURNIQUET:  * No tourniquets in log *  DICTATION: .Dragon Dictation  PLAN OF CARE: Admit to inpatient   PATIENT DISPOSITION:  PACU - hemodynamically stable.   Delay start of Pharmacological VTE agent (>24hrs) due to surgical blood loss or risk of bleeding: yes  Dictation for bipolar prosthesis  The patient was seen in preop where she had an ileal fascial block surgical site was confirmed as left hip. Chart review was completed I checked the implants for availability x-rays were reviewed. She was taken to the operating room for spinal anesthesia she was turned on her right side in the left lateral decubitus position with an axillary roll. She was prepped and draped sterilely and timeout was completed. The incision was made over the greater trochanter of the left tibia was divided down to fascia. After retractors were placed and bleeding was controlled with electrocautery the fascia was split in line with the skin incision. A greater trochanteric bursectomy was performed and it was noted that she had had steroid injections in this area. After the bursectomy we removed from the greater trochanter of the gluteus medius tendon and retracted approximately we preserve  the gluteus minimus tendon splitting it in half and retracting it anteriorly and posteriorly. This included the capsule. The fracture was noted and the hip was dislocated and the head was removed. The head measured 44 mm in diameter.  The proximal femur was prepared per manufacturer at recommendations and serial broaching's were performed up to a size 3 trial 1.5 neck length with a 44 outer diameter head was placed and a trial reduction was performed. Shuck test was good abductor tension was good knee flexion was 120 degrees hip flexion 125 degrees extension external rotation 5 degrees / 45 degrees sleep position stable, the reduction proved stable and the trial components were removed drill holes were placed in the greater trochanter and a #5 Ethibond suture was passed. After thorough irrigation the implants were placed and the hip was reduced. I was able to reproduce the trial reduction and proceeded to close the abductors with the #5 Ethibond suture oversewing it with a #1 Braylon. A second irrigation was performed 15 cc of Marcaine was injected and the hip was placed in abduction the fascia was closed with a running #1 Braylon suture  The subcutaneous tissue was closed with 0 Monocryl in interrupted and then running fashion reapproximating the skin edges with staples  The patient was taken back to the regular bed leg lengths were again checked and found to be equal. Patient was taken recovery room in stable condition  [id number]  Postop plan Weightbearing status as tolerated Staples come out postop day 14 24 hours of antibiotics Direct lateral  hip precautions DVT prophylaxis for 30 days

## 2019-10-03 NOTE — Anesthesia Postprocedure Evaluation (Signed)
Anesthesia Post Note  Patient: Susan Mcgee  Procedure(s) Performed: ARTHROPLASTY BIPOLAR HIP (HEMIARTHROPLASTY) (Left Hip)  Patient location during evaluation: PACU Anesthesia Type: MAC Level of consciousness: awake and alert Pain management: pain level controlled Vital Signs Assessment: post-procedure vital signs reviewed and stable Respiratory status: spontaneous breathing Cardiovascular status: stable Postop Assessment: no apparent nausea or vomiting Anesthetic complications: no     Last Vitals:  Vitals:   10/03/19 1025 10/03/19 1106  BP: (!) 181/53 (!) 152/60  Pulse: (!) 58 73  Resp:    Temp:  36.8 C  SpO2:  97%    Last Pain:  Vitals:   10/03/19 1106  TempSrc: Oral  PainSc: 0-No pain                 Everette Rank

## 2019-10-04 DIAGNOSIS — N179 Acute kidney failure, unspecified: Secondary | ICD-10-CM | POA: Diagnosis present

## 2019-10-04 DIAGNOSIS — D62 Acute posthemorrhagic anemia: Secondary | ICD-10-CM | POA: Diagnosis present

## 2019-10-04 DIAGNOSIS — N183 Chronic kidney disease, stage 3 unspecified: Secondary | ICD-10-CM | POA: Diagnosis present

## 2019-10-04 LAB — CBC
HCT: 33 % — ABNORMAL LOW (ref 36.0–46.0)
Hemoglobin: 10.7 g/dL — ABNORMAL LOW (ref 12.0–15.0)
MCH: 25.7 pg — ABNORMAL LOW (ref 26.0–34.0)
MCHC: 32.4 g/dL (ref 30.0–36.0)
MCV: 79.1 fL — ABNORMAL LOW (ref 80.0–100.0)
Platelets: 292 10*3/uL (ref 150–400)
RBC: 4.17 MIL/uL (ref 3.87–5.11)
RDW: 14.7 % (ref 11.5–15.5)
WBC: 10.2 10*3/uL (ref 4.0–10.5)
nRBC: 0 % (ref 0.0–0.2)

## 2019-10-04 LAB — BASIC METABOLIC PANEL
Anion gap: 12 (ref 5–15)
BUN: 31 mg/dL — ABNORMAL HIGH (ref 8–23)
CO2: 22 mmol/L (ref 22–32)
Calcium: 8.6 mg/dL — ABNORMAL LOW (ref 8.9–10.3)
Chloride: 101 mmol/L (ref 98–111)
Creatinine, Ser: 1.57 mg/dL — ABNORMAL HIGH (ref 0.44–1.00)
GFR calc Af Amer: 36 mL/min — ABNORMAL LOW (ref >60)
GFR calc non Af Amer: 31 mL/min — ABNORMAL LOW (ref >60)
Glucose, Bld: 317 mg/dL — ABNORMAL HIGH (ref 70–99)
Potassium: 4 mmol/L (ref 3.5–5.1)
Sodium: 135 mmol/L (ref 135–145)

## 2019-10-04 LAB — GLUCOSE, CAPILLARY
Glucose-Capillary: 285 mg/dL — ABNORMAL HIGH (ref 70–99)
Glucose-Capillary: 290 mg/dL — ABNORMAL HIGH (ref 70–99)
Glucose-Capillary: 283 mg/dL — ABNORMAL HIGH (ref 70–99)
Glucose-Capillary: 256 mg/dL — ABNORMAL HIGH (ref 70–99)

## 2019-10-04 MED ORDER — HEPARIN SODIUM (PORCINE) 5000 UNIT/ML IJ SOLN
5000.0000 [IU] | Freq: Three times a day (TID) | INTRAMUSCULAR | Status: DC
  Administered 2019-10-04 – 2019-10-05 (×4): 5000 [IU] via SUBCUTANEOUS
  Filled 2019-10-04 (×4): qty 1

## 2019-10-04 MED ORDER — SODIUM CHLORIDE 0.9 % IV SOLN
250.0000 mL | INTRAVENOUS | Status: AC
  Administered 2019-10-04: 250 mL via INTRAVENOUS

## 2019-10-04 MED ORDER — INSULIN GLARGINE 100 UNIT/ML ~~LOC~~ SOLN
10.0000 [IU] | Freq: Every day | SUBCUTANEOUS | Status: DC
  Administered 2019-10-04 – 2019-10-05 (×2): 10 [IU] via SUBCUTANEOUS
  Filled 2019-10-04 (×4): qty 0.1

## 2019-10-04 MED ORDER — LIDOCAINE HCL (PF) 1 % IJ SOLN
INTRAMUSCULAR | Status: DC | PRN
  Administered 2019-10-03: 3 mL

## 2019-10-04 MED ORDER — SENNOSIDES-DOCUSATE SODIUM 8.6-50 MG PO TABS
2.0000 | ORAL_TABLET | Freq: Two times a day (BID) | ORAL | Status: DC
  Administered 2019-10-04 – 2019-10-05 (×3): 2 via ORAL
  Filled 2019-10-04 (×3): qty 2

## 2019-10-04 MED ORDER — SODIUM CHLORIDE 0.9 % IV SOLN: INTRAVENOUS | Status: AC

## 2019-10-04 MED ORDER — BUPIVACAINE HCL (PF) 0.25 % IJ SOLN
INTRAMUSCULAR | Status: DC | PRN
  Administered 2019-10-03: 30 mL

## 2019-10-04 MED ORDER — INSULIN GLARGINE 100 UNIT/ML ~~LOC~~ SOLN: 10.0000 [IU] | Freq: Once | SUBCUTANEOUS | Status: DC

## 2019-10-04 MED ORDER — ONDANSETRON HCL 4 MG PO TABS
4.0000 mg | ORAL_TABLET | Freq: Four times a day (QID) | ORAL | Status: DC
  Administered 2019-10-05: 4 mg via ORAL
  Filled 2019-10-04 (×3): qty 1

## 2019-10-04 MED ORDER — ONDANSETRON HCL 4 MG/2ML IJ SOLN
4.0000 mg | Freq: Four times a day (QID) | INTRAMUSCULAR | Status: DC
  Administered 2019-10-04 – 2019-10-05 (×4): 4 mg via INTRAVENOUS
  Filled 2019-10-04 (×4): qty 2

## 2019-10-04 NOTE — Plan of Care (Signed)
  Problem: Acute Rehab PT Goals(only PT should resolve) Goal: Pt Will Go Supine/Side To Sit Outcome: Progressing Flowsheets (Taken 10/04/2019 1235) Pt will go Supine/Side to Sit: with minimal assist Goal: Pt Will Go Sit To Supine/Side Outcome: Progressing Flowsheets (Taken 10/04/2019 1235) Pt will go Sit to Supine/Side: with minimal assist Goal: Patient Will Transfer Sit To/From Stand Outcome: Progressing Flowsheets (Taken 10/04/2019 1235) Patient will transfer sit to/from stand: with minimal assist Goal: Pt Will Transfer Bed To Chair/Chair To Bed Outcome: Progressing Flowsheets (Taken 10/04/2019 1235) Pt will Transfer Bed to Chair/Chair to Bed: with min assist  12:36 PM, 10/04/19 Wyman Songster PT, DPT Physical Therapist at [facility]

## 2019-10-04 NOTE — Progress Notes (Signed)
Subjective: 1 Day Post-Op Procedure(s) (LRB): ARTHROPLASTY BIPOLAR HIP (HEMIARTHROPLASTY) (Left) Complains of nausea and pain when rolling over  Objective: Vital signs in last 24 hours: Temp:  [97.4 F (36.3 C)-98.2 F (36.8 C)] 98.1 F (36.7 C) (05/14 0521) Pulse Rate:  [58-73] 71 (05/14 0521) Resp:  [16-18] 16 (05/14 0521) BP: (119-181)/(53-92) 148/82 (05/14 0521) SpO2:  [97 %-99 %] 97 % (05/14 0521)  Intake/Output from previous day: 05/13 0701 - 05/14 0700 In: 540 [P.O.:240; IV Piggyback:300] Out: 3301 [Urine:3200; Emesis/NG output:1; Blood:100] Intake/Output this shift: No intake/output data recorded.  Recent Labs    10/02/19 0831 10/03/19 0628 10/04/19 0524  HGB 11.9* 11.5* 10.7*   Recent Labs    10/03/19 0628 10/04/19 0524  WBC 10.1 10.2  RBC 4.50 4.17  HCT 36.4 33.0*  PLT 306 292   Recent Labs    10/03/19 0628 10/04/19 0524  NA 135 135  K 4.6 4.0  CL 102 101  CO2 22 22  BUN 38* 31*  CREATININE 1.92* 1.57*  GLUCOSE 284* 317*  CALCIUM 9.0 8.6*   Recent Labs    10/02/19 0831  INR 1.0    BP (!) 148/82 (BP Location: Left Arm)   Pulse 71   Temp 98.1 F (36.7 C) (Oral)   Resp 16   Ht 5\' 3"  (1.6 m)   Wt 59.4 kg   SpO2 97%   BMI 23.21 kg/m  Awake and alert No abdominal distention Dressing dry Limb alignment normal   Assessment/Plan: 1 Day Post-Op Procedure(s) (LRB): ARTHROPLASTY BIPOLAR HIP (HEMIARTHROPLASTY) (Left)  Aggressive treatment of nausea  Start physical therapy     10/04/2019, 7:34 AM

## 2019-10-04 NOTE — Anesthesia Postprocedure Evaluation (Signed)
Anesthesia Post Note  Patient: Lorri Fukuhara  Procedure(s) Performed: ARTHROPLASTY BIPOLAR HIP (HEMIARTHROPLASTY) (Left Hip)  Patient location during evaluation: Nursing Unit Anesthesia Type: MAC and Spinal Level of consciousness: awake and alert and oriented Pain management: pain level controlled Vital Signs Assessment: post-procedure vital signs reviewed and stable Respiratory status: spontaneous breathing and respiratory function stable Cardiovascular status: blood pressure returned to baseline Postop Assessment: patient able to bend at knees, no backache and no apparent nausea or vomiting (patient c/o headeche yesterday, as per headeche improved todayp) Anesthetic complications: no     Last Vitals:  Vitals:   10/03/19 2112 10/04/19 0521  BP: (!) 172/56 (!) 148/82  Pulse: 73 71  Resp: 16 16  Temp: (!) 36.3 C 36.7 C  SpO2: 98% 97%    Last Pain:  Vitals:   10/04/19 1036  TempSrc:   PainSc: Asleep                 Jamesrobert Ohanesian C Collette Pescador

## 2019-10-04 NOTE — Addendum Note (Signed)
Addendum  created 10/04/19 1421 by Molli Barrows, MD   Clinical Note Signed

## 2019-10-04 NOTE — Care Management Important Message (Signed)
Important Message  Patient Details  Name: Susan Mcgee MRN: [id number] Date of Birth: 1940/12/29   Medicare Important Message Given:  Yes     Corey Harold 10/04/2019, 3:32 PM

## 2019-10-04 NOTE — TOC Progression Note (Addendum)
Transition of Care [facility]) - Progression Note    Patient Details  Name: Susan Mcgee MRN: [id number] Date of Birth: 04-22-1941  Transition of Care [facility]) CM/SW Contact  Elliot Gault, LCSW Phone Number: 10/04/2019, 2:07 PM  Clinical Narrative:     TOC following. Plan remains for transfer to [facility] for rehab when stable. Spoke with Lynnea Ferrier at [facility] today and they can accept pt over the weekend if she is stable. If pt discharges Saturday, her current COVID test is good. If she discharges Sunday or after, she will need new test. Updated MD.  Whitney Muse TOC will follow as needed.  Expected Discharge Plan: Skilled Nursing Facility Barriers to Discharge: Continued Medical Work up  Expected Discharge Plan and Services Expected Discharge Plan: Skilled Nursing Facility In-house Referral: Clinical Social Work     Living arrangements for the past 2 months: Skilled Nursing Facility                                       Social Determinants of Health (SDOH) Interventions    Readmission Risk Interventions No flowsheet data found.

## 2019-10-04 NOTE — Progress Notes (Addendum)
Patient Demographics:    Susan Mcgee, is a 79 y.o. female, DOB - 1940-11-21, DGL:[id number]  Admit date - 10/02/2019   Admitting Physician Tarik Teixeira Mariea Clonts, MD  Outpatient Primary MD for the patient is Patient, No Pcp Per  LOS - 2   Chief Complaint  Patient presents with  . Fall        Subjective:    [name] today has no fevers, no emesis,  No chest pain,   -Complains of headache ,nausea and had one episode of nonbilious nonbloody emesis  Assessment  & Plan :    Principal Problem:   Lt Hip fracture (HCC) Active Problems:   HTN (hypertension)   Diabetes mellitus (HCC)   AKI (acute kidney injury) on CKD   CKD (chronic kidney disease), stage III   Acute postoperative anemia due to expected blood loss  Brief Summary:- 79 y.o. female who is visiting family here from [place] with a past medical history of HTN, DM2 and chronic back pain who had a mechanical fall at home on 10/01/2019 sustaining left hip fracture  -Status post ORIF on 10/03/2019  --Medically ready for discharge to SNF rehab when bed available    A/p 1)Lt Hip Fx- s/p Left ARTHROPLASTY BIPOLAR HIP (HEMIARTHROPLASTY) on 10/03/19 -Further management per orthopedic team -Post discharge to short-term DVT prophylaxis as per orthopedic surgeon  2)AKI Versus AKI on CKDIIIb--favor the later over the former -Suspect worsening renal function due to dehydration -Creatinine on admission was 1.87 -Baseline creatinine--unknown -Creatinine peaked this admission was 1.92 -Creatinine today is down to 1.57 after hydration -Suspect underlying CKD in a Patient with history of uncontrolled DM and HTN renally adjust medications, avoid nephrotoxic agents / dehydration  / hypotension  3)HTN--continue Toprol-XL 100 mg daily and amlodipine 10 mg daily along with as needed labetalol  4)DM2-A1c 10.1 reflecting uncontrolled DM PTA    -Add Lantus 10 units daily -Give Tradjenta in place of Januvia while in the hospital -Amaryl 2 mg with breakfast added Use Novolog/Humalog Sliding scale insulin with Accu-Cheks/Fingersticks as ordered -Diabetic educator input appreciated  5)Acute blood loss Anemia--- 10.7 from 11.9 on admission suspect postop/perioperative blood loss and hemodilution from IV fluids as the culprit -MCV and MCH are low, -Anemia work-up including iron studies FOBT, B12 and folate ordered and pending  Disposition/Need for in-Hospital Stay- patient unable to be discharged at this time due to --status post left hip surgery awaiting SNF placement -Patient From: home D/C Place: SNF Barriers: ---Medically ready for discharge to SNF rehab when bed available  Code Status :  full  Family Communication:   (patient is alert, awake and coherent) --Discussed with patient's granddaughters x2  Consults  :  ortho  DVT Prophylaxis  :    Heparin - SCDs   Lab Results  Component Value Date   PLT 292 10/04/2019   Inpatient Medications  Scheduled Meds: . acetaminophen  500 mg Oral Once  . amLODipine  10 mg Oral Daily  . aspirin EC  325 mg Oral Q breakfast  . Chlorhexidine Gluconate Cloth  6 each Topical Q0600  . glimepiride  2 mg Oral Q breakfast  . heparin injection (subcutaneous)  5,000 Units Subcutaneous Q8H  . insulin aspart  0-4 Units Subcutaneous QHS  .  insulin aspart  0-5 Units Subcutaneous TID WC  . insulin glargine  10 Units Subcutaneous Daily  . linagliptin  5 mg Oral Daily  . metoprolol succinate  100 mg Oral Daily  . mupirocin ointment  1 application Nasal BID  . ondansetron (ZOFRAN) IV  4 mg Intravenous Q6H   Or  . ondansetron  4 mg Oral Q6H  . pneumococcal 23 valent vaccine  0.5 mL Intramuscular Tomorrow-1000  . senna-docusate  2 tablet Oral BID  . sodium chloride flush  3 mL Intravenous Q12H  . traMADol  50 mg Oral Q6H   Continuous Infusions: . sodium chloride    . sodium chloride    .  methocarbamol (ROBAXIN) IV     PRN Meds:.fentaNYL (SUBLIMAZE) injection, labetalol, menthol-cetylpyridinium **OR** phenol, methocarbamol **OR** methocarbamol (ROBAXIN) IV, metoCLOPramide **OR** metoCLOPramide (REGLAN) injection, ondansetron **OR** ondansetron (ZOFRAN) IV, polyethylene glycol, senna-docusate, sodium chloride flush, traZODone    Anti-infectives (From admission, onward)   Start     Dose/Rate Route Frequency Ordered Stop   10/03/19 1900  ceFAZolin (ANCEF) IVPB 2g/100 mL premix     2 g 200 mL/hr over 30 Minutes Intravenous Every 6 hours 10/03/19 1853 10/04/19 0136   10/03/19 0600  ceFAZolin (ANCEF) IVPB 2g/100 mL premix     2 g 200 mL/hr over 30 Minutes Intravenous On call to O.R. 10/02/19 1841 10/03/19 1317        Objective:   Vitals:   10/03/19 1725 10/03/19 1949 10/03/19 2112 10/04/19 0521  BP: (!) 137/92  (!) 172/56 (!) 148/82  Pulse: 66  73 71  Resp: 18  16 16   Temp: 97.6 F (36.4 C)  (!) 97.4 F (36.3 C) 98.1 F (36.7 C)  TempSrc: Axillary  Oral Oral  SpO2: 98% 98% 98% 97%  Weight:      Height:        Wt Readings from Last 3 Encounters:  10/02/19 59.4 kg     Intake/Output Summary (Last 24 hours) at 10/04/2019 1153 Last data filed at 10/04/2019 0800 Gross per 24 hour  Intake 540 ml  Output 3001 ml  Net -2461 ml    Physical Exam  Physical Examination: General appearance - alert, well appearing, and in no distress  Mental status - alert, oriented to person, place, and time,  Eyes - sclera anicteric Neck - supple, no JVD elevation , Chest - clear  to auscultation bilaterally, symmetrical air movement,  Heart - S1 and S2 normal, regular  Abdomen - soft, nontender, nondistended,  Neurological - screening mental status exam normal, neck supple without rigidity, cranial nerves II through XII intact, DTR's normal and symmetric Extremities - no pedal edema noted, intact peripheral pulses --Lt hip postop wound noted  skin - warm, dry   Data Review:    Micro Results Recent Results (from the past 240 hour(s))  SARS Coronavirus 2 by RT PCR (hospital order, performed in [place] hospital lab) Nasopharyngeal Nasopharyngeal Swab     Status: None   Collection Time: 10/02/19 10:02 AM   Specimen: Nasopharyngeal Swab  Result Value Ref Range Status   SARS Coronavirus 2 NEGATIVE NEGATIVE Final    Comment: (NOTE) SARS-CoV-2 target nucleic acids are NOT DETECTED. The SARS-CoV-2 RNA is generally detectable in upper and lower respiratory specimens during the acute phase of infection. The lowest concentration of SARS-CoV-2 viral copies this assay can detect is 250 copies / mL. A negative result does not preclude SARS-CoV-2 infection and should not be used as the sole basis  for treatment or other patient management decisions.  A negative result may occur with improper specimen collection / handling, submission of specimen other than nasopharyngeal swab, presence of viral mutation(s) within the areas targeted by this assay, and inadequate number of viral copies (<250 copies / mL). A negative result must be combined with clinical observations, patient history, and epidemiological information. Fact Sheet for Patients:   BoilerBrush.com.cy Fact Sheet for Healthcare Providers: https://pope.com/ This test is not yet approved or cleared  by the [place] FDA and has been authorized for detection and/or diagnosis of SARS-CoV-2 by FDA under an Emergency Use Authorization (EUA).  This EUA will remain in effect (meaning this test can be used) for the duration of the COVID-19 declaration under Section 564(b)(1) of the Act, 21 U.S.C. section 360bbb-3(b)(1), unless the authorization is terminated or revoked sooner. Performed at [facility], [address], [place], [place] [address]   Surgical PCR screen     Status: None   Collection Time: 10/02/19 12:15 PM   Specimen: Nasal Mucosa; Nasal Swab  Result  Value Ref Range Status   MRSA, PCR NEGATIVE NEGATIVE Final   Staphylococcus aureus NEGATIVE NEGATIVE Final    Comment: (NOTE) The Xpert SA Assay (FDA approved for NASAL specimens in patients 33 years of age and older), is one component of a comprehensive surveillance program. It is not intended to diagnose infection nor to guide or monitor treatment. Performed at [facility], [address], [place], [place] [address]     Radiology Reports DG Chest 1 View  Result Date: 10/02/2019 CLINICAL DATA:  Fall last night. EXAM: CHEST  1 VIEW COMPARISON:  None. FINDINGS: The heart size and mediastinal contours are within normal limits. Both lungs are clear. The visualized skeletal structures are unremarkable. IMPRESSION: No active disease. Electronically Signed   By: Obie Dredge M.D.   On: 10/02/2019 09:23   CT Head Wo Contrast  Result Date: 10/02/2019 CLINICAL DATA:  Fall last night. EXAM: CT HEAD WITHOUT CONTRAST TECHNIQUE: Contiguous axial images were obtained from the base of the skull through the vertex without intravenous contrast. COMPARISON:  None. FINDINGS: Brain: No evidence of acute infarction, hemorrhage, hydrocephalus, extra-axial collection or mass lesion/mass effect. Small chronic lacunar infarct in the right basal ganglia. Mild-to-moderate generalized cerebral atrophy. Scattered mild periventricular and subcortical white matter hypodensities are nonspecific, but favored to reflect chronic microvascular ischemic changes. Vascular: Atherosclerotic vascular calcification of the carotid siphons. No hyperdense vessel. Skull: Normal. Negative for fracture or focal lesion. Sinuses/Orbits: No acute finding. Other: None. IMPRESSION: 1. No acute intracranial abnormality. 2. Atrophy and chronic microvascular ischemic changes. 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 Electronically Signed   By: Obie Dredge M.D.   On: 10/02/2019 09:18   DG Pelvis Portable  Result Date:  10/03/2019 CLINICAL DATA:  Status post left hip replacement EXAM: PORTABLE PELVIS 1-2 VIEWS COMPARISON:  10/02/2019 FINDINGS: Left hip prosthesis is noted in satisfactory position. No acute soft tissue or bony abnormality is seen. Pelvic ring is intact. IMPRESSION: Status post left hip replacement. Electronically Signed   By: Alcide Clever M.D.   On: 10/03/2019 15:40   DG Hip Unilat W or Wo Pelvis 2-3 Views Left  Result Date: 10/02/2019 CLINICAL DATA:  Left hip pain after fall last night. EXAM: DG HIP (WITH OR WITHOUT PELVIS) 2-3V LEFT COMPARISON:  None. FINDINGS: Acute mildly displaced and impacted fracture of the left femoral neck. Possible acute nondisplaced fracture of the left greater trochanter. No dislocation. The  pubic symphysis and sacroiliac joints are intact. Mild bilateral hip degenerative changes. IMPRESSION: 1. Acute mildly displaced and impacted left femoral neck fracture. 2. Possible acute nondisplaced fracture of the left greater trochanter. Electronically Signed   By: Obie Dredge M.D.   On: 10/02/2019 09:22     CBC Recent Labs  Lab 10/02/19 0831 10/03/19 0628 10/04/19 0524  WBC 13.2* 10.1 10.2  HGB 11.9* 11.5* 10.7*  HCT 37.8 36.4 33.0*  PLT 335 306 292  MCV 81.1 80.9 79.1*  MCH 25.5* 25.6* 25.7*  MCHC 31.5 31.6 32.4  RDW 15.2 15.2 14.7  LYMPHSABS 1.4  --   --   MONOABS 0.7  --   --   EOSABS 0.2  --   --   BASOSABS 0.1  --   --     Chemistries  Recent Labs  Lab 10/02/19 0831 10/03/19 0628 10/04/19 0524  NA 135 135 135  K 4.3 4.6 4.0  CL 104 102 101  CO2 20* 22 22  GLUCOSE 236* 284* 317*  BUN 39* 38* 31*  CREATININE 1.87* 1.92* 1.57*  CALCIUM 9.1 9.0 8.6*  AST 18  --   --   ALT 15  --   --   ALKPHOS 58  --   --   BILITOT 0.6  --   --    ------------------------------------------------------------------------------------------------------------------ No results for input(s): CHOL, HDL, LDLCALC, TRIG, CHOLHDL, LDLDIRECT in the last 72 hours.  Lab  Results  Component Value Date   HGBA1C 10.1 (H) 10/02/2019   ------------------------------------------------------------------------------------------------------------------ No results for input(s): TSH, T4TOTAL, T3FREE, THYROIDAB in the last 72 hours.  Invalid input(s): FREET3 ------------------------------------------------------------------------------------------------------------------ No results for input(s): VITAMINB12, FOLATE, FERRITIN, TIBC, IRON, RETICCTPCT in the last 72 hours.  Coagulation profile Recent Labs  Lab 10/02/19 0831  INR 1.0    No results for input(s): DDIMER in the last 72 hours.  Cardiac Enzymes No results for input(s): CKMB, TROPONINI, MYOGLOBIN in the last 168 hours.  Invalid input(s): CK ------------------------------------------------------------------------------------------------------------------ No results found for: BNP   Shon Hale M.D on 10/04/2019 at 11:53 AM  Go to [email or web address] - for contact info  [facility] - Office  [phone]

## 2019-10-04 NOTE — Progress Notes (Signed)
Inpatient Diabetes Program Recommendations  AACE/ADA: New Consensus Statement on Inpatient Glycemic Control (2015)  Target Ranges:  Prepandial:   less than 140 mg/dL      Peak postprandial:   less than 180 mg/dL (1-2 hours)      Critically ill patients:  140 - 180 mg/dL   Lab Results  Component Value Date   GLUCAP 285 (H) 10/04/2019   HGBA1C 10.1 (H) 10/02/2019    Review of Glycemic Control Results for AYAT, DRENNING (MRN [id number]) as of 10/04/2019 08:58  Ref. Range 10/03/2019 07:46 10/03/2019 11:04 10/03/2019 15:32 10/03/2019 16:52 10/03/2019 21:48 10/04/2019 07:29  Glucose-Capillary Latest Ref Range: 70 - 99 mg/dL 237 (H) 271 (H) 277 (H) 261 (H) 290 (H) 285 (H)   Diabetes history: DM 2 Outpatient Diabetes medications: Januvia 50 mg Daily Current orders for Inpatient glycemic control:  Amaryl 2 mg Daily Tradjenta 5 mg Daily Novolog 0-5 units tid + hs  A1c 10.1% this admission PCP not listed  Glucose trends elevated Amaryl started this am. Elevated A1c. May can avoid insulin but unsure at this time will follow glucose trends.  Spoke with pt over the phone. She lives with her husband and she sees a PCP in [place]. Spoke to pt about her A1c level. Pt was surprised it was that high. Pt does report she does not have a meter at home to check her sugars and will need one at time of d/c. Pt reports eating well and drinking sugar free beverages. Discussed the addition of new medication to lower glucose while in the hospital and stressed importance of close monitoring at home and follow up with her PCP. Discussed glucose and A1c goals for home.    D/C: Pt needs Glucose meter kit order #[id number]  Thanks,  Tama Headings RN, MSN, BC-ADM Inpatient Diabetes Coordinator Team Pager [phone] (8a-5p)

## 2019-10-04 NOTE — Evaluation (Signed)
Physical Therapy Evaluation Patient Details Name: Susan Mcgee MRN: [id number] DOB: June 13, 1940 Today's Date: 10/04/2019   History of Present Illness  Susan Mcgee  is a 79 y.o. female who is visiting family here from [place] with a past medical history of HTN, DM2 and chronic back pain who had a mechanical fall at home on 10/01/2019 sustaining left hip fracture -Patient apparently fell while trying to get into bed, today she is unable to bear weight on the left lower extremityPatient also hit her head on the bedroom furniture, during the fall however no loss of consciousness    Clinical Impression  Patient limited for functional mobility as stated below secondary to LE weakness, fatigue, pain, and poor standing balance. Patient unaware of any hip precautions, educated patient and granddaughter on direct lateral approach hip precautions and they verbalized understanding. Patient requires mod assist and verbal cueing to transition to seated EOB. She is able to transfer to standing with mod assist and elevated bed with RW. She is limited by fatigue and nausea today. Patient returned back to bed.  Patient will benefit from continued physical therapy in hospital and recommended venue below to increase strength, balance, endurance for safe ADLs and gait.     Follow Up Recommendations SNF    Equipment Recommendations  None recommended by PT    Recommendations for Other Services       Precautions / Restrictions Precautions Precautions: Fall;Other (comment)(Direct Lateral Hip Precautions) Precaution Comments: Direct Lateral Hip Precautions Restrictions Weight Bearing Restrictions: Yes LLE Weight Bearing: Weight bearing as tolerated      Mobility  Bed Mobility Overal bed mobility: Needs Assistance Bed Mobility: Supine to Sit;Sit to Supine     Supine to sit: Mod assist Sit to supine: Mod assist   General bed mobility comments: slow, labored, mod assist to upright trunk and  for LE movement, frequent verbal cueing for sequencing  Transfers Overall transfer level: Needs assistance Equipment used: Rolling walker (2 wheeled) Transfers: Sit to/from Stand Sit to Stand: Mod assist;From elevated surface         General transfer comment: slow, labored, requires physical assist and cueing for sequencing, difficulty standing fully upright secondary to LE weakness despite frequent cueing for posture  Ambulation/Gait                Stairs            Wheelchair Mobility    Modified Rankin (Stroke Patients Only)       Balance Overall balance assessment: Needs assistance Sitting-balance support: No upper extremity supported;Feet supported Sitting balance-Leahy Scale: Good Sitting balance - Comments: seated EOB     Standing balance-Leahy Scale: Poor Standing balance comment: requires RW use                             Pertinent Vitals/Pain Pain Assessment: No/denies pain(at rest)    Home Living Family/patient expects to be discharged to:: Private residence Living Arrangements: Spouse/significant other   Type of Home: House Home Access: Stairs to enter Entrance Stairs-Rails: Doctor, general practice of Steps: 3 back porch without rail; 5 front porch with rail on both sides but cant reach both Home Layout: Two level;Able to live on main level with bedroom/bathroom Home Equipment: Dan Humphreys - 2 wheels;Cane - single point;Transport chair      Prior Function Level of Independence: Independent         Comments: Patient states independent with with ADL and limited  community ambulation without AD     Hand Dominance        Extremity/Trunk Assessment   Upper Extremity Assessment Upper Extremity Assessment: Overall WFL for tasks assessed    Lower Extremity Assessment Lower Extremity Assessment: Generalized weakness    Cervical / Trunk Assessment Cervical / Trunk Assessment: Normal  Communication    Communication: No difficulties  Cognition Arousal/Alertness: Awake/alert Behavior During Therapy: WFL for tasks assessed/performed Overall Cognitive Status: Within Functional Limits for tasks assessed                                        General Comments      Exercises     Assessment/Plan    PT Assessment Patient needs continued PT services  PT Problem List Decreased strength;Decreased range of motion;Decreased activity tolerance;Decreased balance;Decreased mobility;Decreased knowledge of use of DME;Decreased safety awareness;Decreased knowledge of precautions;Pain       PT Treatment Interventions DME instruction;Therapeutic exercise;Gait training;Balance training;Stair training;Neuromuscular re-education;Functional mobility training;Therapeutic activities;Patient/family education    PT Goals (Current goals can be found in the Care Plan section)  Acute Rehab PT Goals Patient Stated Goal: Go to rehab and go home PT Goal Formulation: With patient Time For Goal Achievement: 10/18/19 Potential to Achieve Goals: Good    Frequency Min 3X/week   Barriers to discharge        Co-evaluation               AM-PAC PT "6 Clicks" Mobility  Outcome Measure Help needed turning from your back to your side while in a flat bed without using bedrails?: A Little Help needed moving from lying on your back to sitting on the side of a flat bed without using bedrails?: A Lot Help needed moving to and from a bed to a chair (including a wheelchair)?: A Lot Help needed standing up from a chair using your arms (e.g., wheelchair or bedside chair)?: A Lot Help needed to walk in hospital room?: A Lot Help needed climbing 3-5 steps with a railing? : Total 6 Click Score: 12    End of Session Equipment Utilized During Treatment: Gait belt Activity Tolerance: Patient tolerated treatment well;Patient limited by fatigue Patient left: in bed;with call bell/phone within reach;with  bed alarm set;with family/visitor present Nurse Communication: Mobility status;Precautions;Weight bearing status PT Visit Diagnosis: Unsteadiness on feet (R26.81);Other abnormalities of gait and mobility (R26.89);Muscle weakness (generalized) (M62.81);Repeated falls (R29.6)    Time: 6734-1937 PT Time Calculation (min) (ACUTE ONLY): 26 min   Charges:   PT Evaluation $PT Eval Moderate Complexity: 1 Mod PT Treatments $Therapeutic Activity: 8-22 mins        12:30 PM, 10/04/19 Mearl Latin PT, DPT Physical Therapist at [facility]

## 2019-10-05 ENCOUNTER — Inpatient Hospital Stay
Admission: RE | Admit: 2019-10-05 | Discharge: 2019-10-24 | Disposition: A | Discharge status: Home / Self Care | Payer: Medicare Other | Source: Ambulatory Visit | Attending: Internal Medicine | Admitting: Internal Medicine

## 2019-10-05 DIAGNOSIS — I10 Essential (primary) hypertension: Secondary | ICD-10-CM

## 2019-10-05 DIAGNOSIS — N183 Chronic kidney disease, stage 3 unspecified: Secondary | ICD-10-CM

## 2019-10-05 DIAGNOSIS — D62 Acute posthemorrhagic anemia: Secondary | ICD-10-CM

## 2019-10-05 DIAGNOSIS — N179 Acute kidney failure, unspecified: Secondary | ICD-10-CM

## 2019-10-05 LAB — IRON AND TIBC
Iron: 14 ug/dL — ABNORMAL LOW (ref 28–170)
Saturation Ratios: 6 % — ABNORMAL LOW (ref 10.4–31.8)
TIBC: 233 ug/dL — ABNORMAL LOW (ref 250–450)
UIBC: 219 ug/dL

## 2019-10-05 LAB — GLUCOSE, CAPILLARY
Glucose-Capillary: 229 mg/dL — ABNORMAL HIGH (ref 70–99)
Glucose-Capillary: 202 mg/dL — ABNORMAL HIGH (ref 70–99)

## 2019-10-05 LAB — FERRITIN: Ferritin: 72 ng/mL (ref 11–307)

## 2019-10-05 LAB — VITAMIN B12: Vitamin B-12: 144 pg/mL — ABNORMAL LOW (ref 180–914)

## 2019-10-05 LAB — FOLATE: Folate: 5.3 ng/mL — ABNORMAL LOW (ref >5.9)

## 2019-10-05 MED ORDER — TRAMADOL HCL 50 MG PO TABS: 50.0000 mg | ORAL_TABLET | Freq: Four times a day (QID) | ORAL | 0 refills | Status: DC

## 2019-10-05 MED ORDER — ENOXAPARIN SODIUM 40 MG/0.4ML ~~LOC~~ SOLN
40.0000 mg | SUBCUTANEOUS | Status: DC
Start: 2019-10-05 — End: 2019-10-22

## 2019-10-05 MED ORDER — POLYETHYLENE GLYCOL 3350 17 G PO PACK: 17.0000 g | PACK | Freq: Every day | ORAL | 0 refills | Status: DC | PRN

## 2019-10-05 MED ORDER — TAMSULOSIN HCL 0.4 MG PO CAPS: 0.4000 mg | ORAL_CAPSULE | Freq: Every day | ORAL | Status: DC

## 2019-10-05 MED ORDER — INSULIN GLARGINE 100 UNIT/ML ~~LOC~~ SOLN: 10.0000 [IU] | Freq: Every day | SUBCUTANEOUS | 11 refills | Status: DC

## 2019-10-05 MED ORDER — GLIMEPIRIDE 2 MG PO TABS: 2.0000 mg | ORAL_TABLET | Freq: Every day | ORAL | Status: DC

## 2019-10-05 NOTE — Progress Notes (Signed)
Patient unable to void, bladder scan shows 400cc urine.  MD informed, foley cath inserted per order with 400cc return of clear urine.  [place] at [facility] informed

## 2019-10-05 NOTE — Progress Notes (Signed)
Report called to the [facility]. 

## 2019-10-05 NOTE — Progress Notes (Signed)
Patient to be transferred to [facility] today. RN to call report in to [place] [phone].   [address], LCSW Transition of Care [phone]

## 2019-10-05 NOTE — Discharge Summary (Addendum)
Physician Discharge Summary  Lisaanne Lawrie KXF:[id number] DOB: January 06, 1941 DOA: 10/02/2019  PCP: Patient, No Pcp Per  Admit date: 10/02/2019 Discharge date: 10/05/2019  Admitted From: home Disposition:  SNF  Recommendations for Outpatient Follow-up:  1. Follow up with PCP in 1-2 weeks 2. Please obtain BMP/CBC in one week 3. Follow-up with Dr. Romeo Apple in 2 weeks 4. Remove staples on 5/27 5. Patient will need foley catheter voiding trial in the next 48 hours  Discharge Condition: Stable CODE STATUS: Full code Diet recommendation: Heart healthy, carb  Brief/Interim Summary: 79 year old female who was visiting [place] with past medical history of hypertension diabetes chronic back pain, suffered a mechanical fall and sustained left hip fracture.  She was admitted for further treatments.  Discharge Diagnoses:  Principal Problem:   Lt Hip fracture (HCC) Active Problems:   HTN (hypertension)   Diabetes mellitus (HCC)   AKI (acute kidney injury) on CKD   CKD (chronic kidney disease), stage III   Acute postoperative anemia due to expected blood loss  1. Left hip fracture.  Status post left arthroplasty bipolar hip.  Postoperative course has been unremarkable.  Follow-up with orthopedics in the next 2 weeks.  DVT prophylaxis with Lovenox.  Seen by physical therapy with recommendations for symptoms in sling placement. 2. Acute kidney ecchymosis stage III.  Creatinine on admission 1.8.  She received IV fluids, creatinine improved to 1.9.  Continue insulin patient. 3. Hypertension.  Continued on Toprol.   4. Diabetes.  Started on Lantus.  Continue Januvia.  Abdominal still slightly tender regimen.  Chest symptoms 5. Blood sugars. 6. Acute blood loss anemia related to recent surgery.  Hemoglobin is currently stable.  Continue to follow CBC.  No signs of bleeding. 7. Urinary retention. Patient noted to have urinary retention prior to discharge. She has been started on flomax and had foley  catheter placed. She will need a voiding trial in the next 48 hours. If she fails voiding trial, she will need referral to urology  Discharge Instructions  Discharge Instructions    Diet - low sodium heart healthy   Complete by: As directed    Increase activity slowly   Complete by: As directed      Allergies as of 10/05/2019   No Known Allergies     Medication List    TAKE these medications   amLODipine 10 MG tablet Commonly known as: NORVASC Take 10 mg by mouth daily.   enoxaparin 40 MG/0.4ML injection Commonly known as: LOVENOX Inject 0.4 mLs (40 mg total) into the skin daily. For 28 days   glimepiride 2 MG tablet Commonly known as: AMARYL Take 1 tablet (2 mg total) by mouth daily with breakfast. Start taking on: Oct 06, 2019   insulin glargine 100 UNIT/ML injection Commonly known as: LANTUS Inject 0.1 mLs (10 Units total) into the skin daily. Start taking on: Oct 06, 2019   Januvia 50 MG tablet Generic drug: sitaGLIPtin Take 50 mg by mouth daily.   metoprolol succinate 100 MG 24 hr tablet Commonly known as: TOPROL-XL Take 100 mg by mouth daily. Take with or immediately following a meal.   polyethylene glycol 17 g packet Commonly known as: MIRALAX / GLYCOLAX Take 17 g by mouth daily as needed for mild constipation.   tamsulosin 0.4 MG Caps capsule Commonly known as: FLOMAX Take 1 capsule (0.4 mg total) by mouth daily after supper.   traMADol 50 MG tablet Commonly known as: ULTRAM Take 1 tablet (50 mg total) by mouth every  6 (six) hours.      Contact information for after-discharge care    [facility] .   Service: Skilled Nursing Contact information: [address] [place] [phone]             No Known Allergies  Consultations:  orthopedics   Procedures/Studies: DG Chest 1 View  Result Date: 10/02/2019 CLINICAL DATA:  Fall last night. EXAM: CHEST  1 VIEW  COMPARISON:  None. FINDINGS: The heart size and mediastinal contours are within normal limits. Both lungs are clear. The visualized skeletal structures are unremarkable. IMPRESSION: No active disease. Electronically Signed   By: Titus Dubin M.D.   On: 10/02/2019 09:23   CT Head Wo Contrast  Result Date: 10/02/2019 CLINICAL DATA:  Fall last night. EXAM: CT HEAD WITHOUT CONTRAST TECHNIQUE: Contiguous axial images were obtained from the base of the skull through the vertex without intravenous contrast. COMPARISON:  None. FINDINGS: Brain: No evidence of acute infarction, hemorrhage, hydrocephalus, extra-axial collection or mass lesion/mass effect. Small chronic lacunar infarct in the right basal ganglia. Mild-to-moderate generalized cerebral atrophy. Scattered mild periventricular and subcortical white matter hypodensities are nonspecific, but favored to reflect chronic microvascular ischemic changes. Vascular: Atherosclerotic vascular calcification of the carotid siphons. No hyperdense vessel. Skull: Normal. Negative for fracture or focal lesion. Sinuses/Orbits: No acute finding. Other: None. IMPRESSION: 1. No acute intracranial abnormality. 2. Atrophy and chronic microvascular ischemic changes. 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 Electronically Signed   By: Titus Dubin M.D.   On: 10/02/2019 09:18   DG Pelvis Portable  Result Date: 10/03/2019 CLINICAL DATA:  Status post left hip replacement EXAM: PORTABLE PELVIS 1-2 VIEWS COMPARISON:  10/02/2019 FINDINGS: Left hip prosthesis is noted in satisfactory position. No acute soft tissue or bony abnormality is seen. Pelvic ring is intact. IMPRESSION: Status post left hip replacement. Electronically Signed   By: Inez Catalina M.D.   On: 10/03/2019 15:40   DG Hip Unilat W or Wo Pelvis 2-3 Views Left  Result Date: 10/02/2019 CLINICAL DATA:  Left hip pain after fall last night. EXAM: DG HIP (WITH OR WITHOUT PELVIS) 2-3V LEFT COMPARISON:  None.  FINDINGS: Acute mildly displaced and impacted fracture of the left femoral neck. Possible acute nondisplaced fracture of the left greater trochanter. No dislocation. The pubic symphysis and sacroiliac joints are intact. Mild bilateral hip degenerative changes. IMPRESSION: 1. Acute mildly displaced and impacted left femoral neck fracture. 2. Possible acute nondisplaced fracture of the left greater trochanter. Electronically Signed   By: Titus Dubin M.D.   On: 10/02/2019 09:22      Subjective: Patient reports pain is controlled, no shortness of breath  Discharge Exam: Vitals:   10/04/19 1936 10/04/19 2056 10/05/19 0614 10/05/19 1451  BP:  (!) 155/53 (!) 176/67 (!) 150/53  Pulse:  68 76 (!) 59  Resp:  16    Temp:  98.3 F (36.8 C) 98.7 F (37.1 C) 98.3 F (36.8 C)  TempSrc:  Oral  Oral  SpO2: 95% 96% 96% 97%  Weight:      Height:        General: Pt is alert, awake, not in acute distress Cardiovascular: RRR, S1/S2 +, no rubs, no gallops Respiratory: CTA bilaterally, no wheezing, no rhonchi Abdominal: Soft, NT, ND, bowel sounds + Extremities: no edema, no cyanosis    The results of significant diagnostics from this hospitalization (including imaging, microbiology, ancillary and laboratory) are listed below  for reference.     Microbiology: Recent Results (from the past 240 hour(s))  SARS Coronavirus 2 by RT PCR (hospital order, performed in [facility] hospital lab) Nasopharyngeal Nasopharyngeal Swab     Status: None   Collection Time: 10/02/19 10:02 AM   Specimen: Nasopharyngeal Swab  Result Value Ref Range Status   SARS Coronavirus 2 NEGATIVE NEGATIVE Final    Comment: (NOTE) SARS-CoV-2 target nucleic acids are NOT DETECTED. The SARS-CoV-2 RNA is generally detectable in upper and lower respiratory specimens during the acute phase of infection. The lowest concentration of SARS-CoV-2 viral copies this assay can detect is 250 copies / mL. A negative result does not  preclude SARS-CoV-2 infection and should not be used as the sole basis for treatment or other patient management decisions.  A negative result may occur with improper specimen collection / handling, submission of specimen other than nasopharyngeal swab, presence of viral mutation(s) within the areas targeted by this assay, and inadequate number of viral copies (<250 copies / mL). A negative result must be combined with clinical observations, patient history, and epidemiological information. Fact Sheet for Patients:   BoilerBrush.com.cy Fact Sheet for Healthcare Providers: https://pope.com/ This test is not yet approved or cleared  by the [place] FDA and has been authorized for detection and/or diagnosis of SARS-CoV-2 by FDA under an Emergency Use Authorization (EUA).  This EUA will remain in effect (meaning this test can be used) for the duration of the COVID-19 declaration under Section 564(b)(1) of the Act, 21 U.S.C. section 360bbb-3(b)(1), unless the authorization is terminated or revoked sooner. Performed at [facility], [address] [place], [place] [address]   Surgical PCR screen     Status: None   Collection Time: 10/02/19 12:15 PM   Specimen: Nasal Mucosa; Nasal Swab  Result Value Ref Range Status   MRSA, PCR NEGATIVE NEGATIVE Final   Staphylococcus aureus NEGATIVE NEGATIVE Final    Comment: (NOTE) The Xpert SA Assay (FDA approved for NASAL specimens in patients 83 years of age and older), is one component of a comprehensive surveillance program. It is not intended to diagnose infection nor to guide or monitor treatment. Performed at [facility], [address], [place], [place] [address]      Labs: BNP (last 3 results) No results for input(s): BNP in the last 8760 hours. Basic Metabolic Panel: Recent Labs  Lab 10/02/19 0831 10/03/19 0628 10/04/19 0524  NA 135 135 135  K 4.3 4.6 4.0  CL 104 102 101  CO2  20* 22 22  GLUCOSE 236* 284* 317*  BUN 39* 38* 31*  CREATININE 1.87* 1.92* 1.57*  CALCIUM 9.1 9.0 8.6*   Liver Function Tests: Recent Labs  Lab 10/02/19 0831  AST 18  ALT 15  ALKPHOS 58  BILITOT 0.6  PROT 8.3*  ALBUMIN 4.2   No results for input(s): LIPASE, AMYLASE in the last 168 hours. No results for input(s): AMMONIA in the last 168 hours. CBC: Recent Labs  Lab 10/02/19 0831 10/03/19 0628 10/04/19 0524  WBC 13.2* 10.1 10.2  NEUTROABS 10.8*  --   --   HGB 11.9* 11.5* 10.7*  HCT 37.8 36.4 33.0*  MCV 81.1 80.9 79.1*  PLT 335 306 292   Cardiac Enzymes: No results for input(s): CKTOTAL, CKMB, CKMBINDEX, TROPONINI in the last 168 hours. BNP: Invalid input(s): POCBNP CBG: Recent Labs  Lab 10/04/19 1127 10/04/19 1743 10/04/19 2317 10/05/19 0727 10/05/19 1112  GLUCAP 290* 283* 256* 229* 202*   D-Dimer No  results for input(s): DDIMER in the last 72 hours. Hgb A1c No results for input(s): HGBA1C in the last 72 hours. Lipid Profile No results for input(s): CHOL, HDL, LDLCALC, TRIG, CHOLHDL, LDLDIRECT in the last 72 hours. Thyroid function studies No results for input(s): TSH, T4TOTAL, T3FREE, THYROIDAB in the last 72 hours.  Invalid input(s): FREET3 Anemia work up [employer]    10/05/19 0647  VITAMINB12 144*  FOLATE 5.3*  FERRITIN 72  TIBC 233*  IRON 14*   Urinalysis No results found for: COLORURINE, APPEARANCEUR, LABSPEC, PHURINE, GLUCOSEU, HGBUR, BILIRUBINUR, KETONESUR, PROTEINUR, UROBILINOGEN, NITRITE, LEUKOCYTESUR Sepsis Labs Invalid input(s): PROCALCITONIN,  WBC,  LACTICIDVEN Microbiology Recent Results (from the past 240 hour(s))  SARS Coronavirus 2 by RT PCR (hospital order, performed in [facility] hospital lab) Nasopharyngeal Nasopharyngeal Swab     Status: None   Collection Time: 10/02/19 10:02 AM   Specimen: Nasopharyngeal Swab  Result Value Ref Range Status   SARS Coronavirus 2 NEGATIVE NEGATIVE Final    Comment: (NOTE) SARS-CoV-2  target nucleic acids are NOT DETECTED. The SARS-CoV-2 RNA is generally detectable in upper and lower respiratory specimens during the acute phase of infection. The lowest concentration of SARS-CoV-2 viral copies this assay can detect is 250 copies / mL. A negative result does not preclude SARS-CoV-2 infection and should not be used as the sole basis for treatment or other patient management decisions.  A negative result may occur with improper specimen collection / handling, submission of specimen other than nasopharyngeal swab, presence of viral mutation(s) within the areas targeted by this assay, and inadequate number of viral copies (<250 copies / mL). A negative result must be combined with clinical observations, patient history, and epidemiological information. Fact Sheet for Patients:   BoilerBrush.com.cy Fact Sheet for Healthcare Providers: https://pope.com/ This test is not yet approved or cleared  by the [place] FDA and has been authorized for detection and/or diagnosis of SARS-CoV-2 by FDA under an Emergency Use Authorization (EUA).  This EUA will remain in effect (meaning this test can be used) for the duration of the COVID-19 declaration under Section 564(b)(1) of the Act, 21 U.S.C. section 360bbb-3(b)(1), unless the authorization is terminated or revoked sooner. Performed at [facility], [address], [place], [place] [address]   Surgical PCR screen     Status: None   Collection Time: 10/02/19 12:15 PM   Specimen: Nasal Mucosa; Nasal Swab  Result Value Ref Range Status   MRSA, PCR NEGATIVE NEGATIVE Final   Staphylococcus aureus NEGATIVE NEGATIVE Final    Comment: (NOTE) The Xpert SA Assay (FDA approved for NASAL specimens in patients 96 years of age and older), is one component of a comprehensive surveillance program. It is not intended to diagnose infection nor to guide or monitor treatment. Performed at [facility], [address], [place], [place] [address]      Time coordinating discharge:  SIGNED:   Erick Blinks, MD  [facility] 10/05/2019, 3:13 PM   If 7PM-7AM, please contact night-coverage [email or web address]

## 2019-10-06 LAB — TYPE AND SCREEN
ABO/RH(D): A NEG
Antibody Screen: NEGATIVE
Unit division: 0
Unit division: 0

## 2019-10-06 LAB — BPAM RBC
Blood Product Expiration Date: 202105262359
Blood Product Expiration Date: 202106132359
Unit Type and Rh: 600
Unit Type and Rh: 600

## 2019-10-07 ENCOUNTER — Encounter: Payer: Self-pay | Admitting: Adult Health

## 2019-10-07 ENCOUNTER — Non-Acute Institutional Stay (SKILLED_NURSING_FACILITY): Payer: Medicare Other | Admitting: Adult Health

## 2019-10-07 DIAGNOSIS — D62 Acute posthemorrhagic anemia: Secondary | ICD-10-CM

## 2019-10-07 DIAGNOSIS — N183 Chronic kidney disease, stage 3 unspecified: Secondary | ICD-10-CM

## 2019-10-07 DIAGNOSIS — S72002D Fracture of unspecified part of neck of left femur, subsequent encounter for closed fracture with routine healing: Secondary | ICD-10-CM

## 2019-10-07 DIAGNOSIS — E1121 Type 2 diabetes mellitus with diabetic nephropathy: Secondary | ICD-10-CM

## 2019-10-07 DIAGNOSIS — R339 Retention of urine, unspecified: Secondary | ICD-10-CM

## 2019-10-07 DIAGNOSIS — K5909 Other constipation: Secondary | ICD-10-CM

## 2019-10-07 DIAGNOSIS — E1122 Type 2 diabetes mellitus with diabetic chronic kidney disease: Secondary | ICD-10-CM | POA: Diagnosis not present

## 2019-10-07 DIAGNOSIS — Z794 Long term (current) use of insulin: Secondary | ICD-10-CM

## 2019-10-07 DIAGNOSIS — I129 Hypertensive chronic kidney disease with stage 1 through stage 4 chronic kidney disease, or unspecified chronic kidney disease: Secondary | ICD-10-CM

## 2019-10-07 DIAGNOSIS — N1832 Chronic kidney disease, stage 3b: Secondary | ICD-10-CM

## 2019-10-07 DIAGNOSIS — D509 Iron deficiency anemia, unspecified: Secondary | ICD-10-CM

## 2019-10-07 NOTE — Progress Notes (Signed)
Location:    [facility] Room Number: 153/P Place of Service:  SNF (31)   CODE STATUS: Full Code  No Known Allergies  Chief Complaint  Patient presents with  . Hospitalization Follow-up    Hospitalization Follow Up    HPI:  She is a 79 year old woman who has been hospitalized from 10-02-19 through 10-05-19. She had a mechanical fall and suffered a displaced left femoral neck fracture. She had a left hemiarthroplasty on 10-03-19.  She is here for short term rehab with her goal to return back home. She denies any uncontrolled pain is complaining of nausea and constipation. She will continue to be followed for her chronic illnesses including: hypertension; constipation; diabetes  Past Medical History:  Diagnosis Date  . Diabetes mellitus without complication (HCC)   . Hypertension     Past Surgical History:  Procedure Laterality Date  . CATARACT EXTRACTION    . HIP ARTHROPLASTY Left 10/03/2019   Procedure: ARTHROPLASTY BIPOLAR HIP (HEMIARTHROPLASTY);  Surgeon: Vickki Hearing, MD;  Location: [facility];  Service: Orthopedics;  Laterality: Left;  . TRACHEOSTOMY     when she was born  . TUBAL LIGATION      Social History   Socioeconomic History  . Marital status: Married    Spouse name: Not on file  . Number of children: Not on file  . Years of education: Not on file  . Highest education level: Not on file  Occupational History  . Not on file  Tobacco Use  . Smoking status: Never Smoker  . Smokeless tobacco: Never Used  Substance and Sexual Activity  . Alcohol use: Never  . Drug use: Never  . Sexual activity: Not on file  Other Topics Concern  . Not on file  Social History Narrative  . Not on file   Social Determinants of Health   Financial Resource Strain:   . Difficulty of Paying Living Expenses:   Food Insecurity:   . Worried About Programme researcher, broadcasting/film/video in the Last Year:   . Barista in the Last Year:   Transportation Needs:   .  Freight forwarder (Medical):   Marland Kitchen Lack of Transportation (Non-Medical):   Physical Activity:   . Days of Exercise per Week:   . Minutes of Exercise per Session:   Stress:   . Feeling of Stress :   Social Connections:   . Frequency of Communication with Friends and Family:   . Frequency of Social Gatherings with Friends and Family:   . Attends Religious Services:   . Active Member of Clubs or Organizations:   . Attends Banker Meetings:   Marland Kitchen Marital Status:   Intimate Partner Violence:   . Fear of Current or Ex-Partner:   . Emotionally Abused:   Marland Kitchen Physically Abused:   . Sexually Abused:    No family history on file.    VITAL SIGNS BP (!) 110/50   Pulse 65   Temp 98.3 F (36.8 C) (Oral)   Resp 18   Ht 5\' 3"  (1.6 m)   Wt 140 lb 4.8 oz (63.6 kg)   SpO2 93%   BMI 24.85 kg/m   Outpatient Encounter Medications as of 10/07/2019  Medication Sig  . amLODipine (NORVASC) 10 MG tablet Take 10 mg by mouth daily.  10/09/2019 enoxaparin (LOVENOX) 40 MG/0.4ML injection Inject 0.4 mLs (40 mg total) into the skin daily. For 28 days  . glimepiride (AMARYL) 2 MG tablet  Take 1 tablet (2 mg total) by mouth daily with breakfast.  . insulin glargine (LANTUS) 100 UNIT/ML injection Inject 0.1 mLs (10 Units total) into the skin daily.  . metoprolol succinate (TOPROL-XL) 100 MG 24 hr tablet Take 100 mg by mouth daily. Take with or immediately following a meal.  . NON FORMULARY Diet: _____ Regular,---X--- Liquid Regular ___X__ NAS, ____X___Consistent Carbohydrate, _______NPO _____Other  . polyethylene glycol (MIRALAX / GLYCOLAX) 17 g packet Take 17 g by mouth daily as needed for mild constipation.  . sitaGLIPtin (JANUVIA) 50 MG tablet Take 50 mg by mouth daily.  . tamsulosin (FLOMAX) 0.4 MG CAPS capsule Take 1 capsule (0.4 mg total) by mouth daily after supper.  . traMADol (ULTRAM) 50 MG tablet Take 1 tablet (50 mg total) by mouth every 6 (six) hours.   No facility-administered encounter  medications on file as of 10/07/2019.     SIGNIFICANT DIAGNOSTIC EXAMS  TODAY  10-02-19: ct of head:  1. No acute intracranial abnormality. 2. Atrophy and chronic microvascular ischemic changes. 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  10-02-19: left hip x-ray:  1. Acute mildly displaced and impacted left femoral neck fracture. 2. Possible acute nondisplaced fracture of the left greater trochanter.  10-02-19: chest x-ray; No active disease   LABS REVIEWED TODAY  10-02-19: wbc 13.2; hgb 11.9; hct 37.8; mcv 81.1 plt 335; glucose 236; bun 39; creat 1.87; k+ 4.3; na++ 135; ca 9.1 liver normal albumin 4.2 hgb a1c 10.1 10-04-19: wbc 10.2; hgb 10.7; hct 33.0; mcv 79.1 plt 292; glucose 317; bun 31; creat 1.57; k+ 4.0; na++ 135; ca 8.6 10-05-19: ferritin 72; iron 14; tibc 233; vit B 12: 233 folate 5.3   Review of Systems  Constitutional: Negative for malaise/fatigue.  Respiratory: Negative for cough and shortness of breath.   Cardiovascular: Negative for chest pain, palpitations and leg swelling.  Gastrointestinal: Positive for constipation and nausea. Negative for abdominal pain and heartburn.  Musculoskeletal: Negative for back pain, joint pain and myalgias.  Skin: Negative.   Neurological: Negative for dizziness.  Psychiatric/Behavioral: The patient is not nervous/anxious.    Physical Exam Constitutional:      General: She is not in acute distress.    Appearance: She is well-developed. She is not diaphoretic.  Eyes:     Comments: History of cataract removal   Neck:     Thyroid: No thyromegaly.  Cardiovascular:     Rate and Rhythm: Normal rate and regular rhythm.     Pulses: Normal pulses.     Heart sounds: Normal heart sounds.  Pulmonary:     Effort: Pulmonary effort is normal. No respiratory distress.     Breath sounds: Normal breath sounds.  Abdominal:     General: Bowel sounds are normal. There is no distension.     Palpations: Abdomen is soft.     Tenderness: There  is no abdominal tenderness.  Musculoskeletal:     Cervical back: Neck supple.     Right lower leg: No edema.     Left lower leg: No edema.     Comments: Is able to move all extremities  Is status post left hip hemiarthroplasty 10-03-19  Lymphadenopathy:     Cervical: No cervical adenopathy.  Skin:    General: Skin is warm and dry.  Neurological:     Mental Status: She is alert and oriented to person, place, and time.  Psychiatric:        Mood and Affect: Mood normal.  ASSESSMENT/ PLAN:  TODAY  1. Closed displaced fracture of left femoral neck with routine healing: is stable will continue therapy as directed and will follow up with orthopedics. Will stop ultram due to her nausea; will continue to monitor her status.   2. Stage 3 chronic renal impairment associated with type 2 diabetes mellitus: is stable bun 31; creat 1.57  3. 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 Hypertension associated with stage 3 chronic kidney disease due to type 2 diabetes mellitus: is stable b/p 110/50 is stable will continue norvasc 10 mg daily and toprol xl 100 mg daily   5. Acute postoperative anemia due to expected blood loss/microcytic hypochromic anemia : is stable hgb 10.7; vit B 12: 144: will begin vit B 12 supplement  will monitor   6. Urine retention: is stable will continue flomax 0.4 mg daily   7. Chronic constipation: is worse will change to miralax daily on a routine basis.     MD is aware of resident's narcotic use and is in agreement with current plan of care. We will attempt to wean resident as appropriate.  Synthia Innocent NP [facility]  Contact [phone] Monday through Friday 8am- 5pm  After hours call [phone]

## 2019-10-08 ENCOUNTER — Encounter: Payer: Self-pay | Admitting: Internal Medicine

## 2019-10-08 ENCOUNTER — Non-Acute Institutional Stay (SKILLED_NURSING_FACILITY): Payer: Medicare Other | Admitting: Internal Medicine

## 2019-10-08 DIAGNOSIS — E1321 Other specified diabetes mellitus with diabetic nephropathy: Secondary | ICD-10-CM

## 2019-10-08 DIAGNOSIS — E538 Deficiency of other specified B group vitamins: Secondary | ICD-10-CM

## 2019-10-08 DIAGNOSIS — S72002D Fracture of unspecified part of neck of left femur, subsequent encounter for closed fracture with routine healing: Secondary | ICD-10-CM

## 2019-10-08 DIAGNOSIS — N1832 Chronic kidney disease, stage 3b: Secondary | ICD-10-CM

## 2019-10-08 DIAGNOSIS — D509 Iron deficiency anemia, unspecified: Secondary | ICD-10-CM | POA: Insufficient documentation

## 2019-10-08 NOTE — Assessment & Plan Note (Addendum)
Creatinine 1.57 L4 31 compatible with CKD stage IIIb. D/C Amaryl because of high risk of hypoglycemia especially with insulin.

## 2019-10-08 NOTE — Progress Notes (Signed)
NURSING HOME LOCATION: [facility] ROOM NUMBER:  153/P  CODE STATUS: Full code  PCP: Dr. Mayer Masker, [place], [place]  This is a comprehensive admission note to [facility] performed on this date less than 30 days from date of admission. Included are preadmission medical/surgical history; reconciled medication list; family history; social history and comprehensive review of systems.  Corrections and additions to the records were documented. Comprehensive physical exam was also performed. Additionally a clinical summary was entered for each active diagnosis pertinent to this admission in the Problem List to enhance continuity of care.  HPI: Patient was hospitalized 5/12-5/15/2021 admitted from home having sustained a left hip fracture in a mechanical fall.  Apparently she was trying to get into a "high" bed in her granddaughter's house here in [place] when she slipped to the floor.  There was no cardiac or neurologic prodrome prior to the fall. Hospital course was complicated by AKI superimposed on CKD stage III.  Left arthroplasty bipolar hip was performed 5/13 by Dr. Arther Abbott. Postoperative anemia wasattributed to expected blood loss perioperatively.  H/H remained stable with no active bleeding dyscrasias. Flomax was initiated for urinary retention noted prior to discharge.  Foley catheter voiding trial within the first 48 hours of SNF admission was recommended.  If voiding trial was failed; urology referral is recommended. DVT prophylaxis with Lovenox was recommended x2 weeks. BMP/CBC was recommended in 1 week.  Orthopedic follow-up with Dr. Aline Brochure was to be in 2 weeks postop.  Staples were to be removed 10/17/2019.  Past medical and surgical history: Includes essential hypertension, diabetes with renal disease, and CKD.  Social history: Never drank alcohol, never smoked.  High school graduate and lifelong homemaker.  She came from [place], [place] with her husband's for his medical care in [place], [place].  They were staying with her granddaughter here in [place], [place].  Family history: Sister had kidney cancer and a brother had stroke.  There is no family history of colon cancer.  Patient has never had a colonoscopy.  SNF course to date: FBS range 89-168; AC lunch 276-296; AC evening meal 173-224; and 8 PM 170-180.  A1c was 10.1% on 10/02/2019; she is now on glimepiride, Januvia as well as basal insulin.  Review of systems: She could not give the day of the month but knew it was May 2021.  She states that she continues to have pain in the left leg but it is improved. She validates that she previously was on B12 shots but has not taken them for at least a year.  She denies any bleeding dyscrasias. She states that she has been told that she snores but denies any history of apnea. When asked if she had anxiety or depression; she commented "I don't know what that means". Despite the history of B12 deficiency; she denies any paresthesias.   Constitutional: No fever, significant weight change, fatigue  Eyes: No redness, discharge, pain, vision change ENT/mouth: No nasal congestion, purulent discharge, earache, change in hearing, sore throat  Cardiovascular: No chest pain, palpitations, paroxysmal nocturnal dyspnea, claudication, edema  Respiratory: No cough, sputum production, hemoptysis, DOE  Gastrointestinal: No heartburn, dysphagia, abdominal pain, nausea /vomiting, rectal bleeding, melena, change in bowels Genitourinary: No dysuria, hematuria, pyuria, incontinence, nocturia Dermatologic: No rash, pruritus, change in appearance of skin Neurologic: No dizziness, headache, syncope, seizures, numbness, tingling Psychiatric: No  insomnia, anorexia Endocrine: No change in hair/skin/nails, excessive thirst, excessive hunger, excessive urination  Hematologic/lymphatic: No significant bruising, lymphadenopathy,  abnormal bleeding Allergy/immunology: No itchy/watery eyes, significant sneezing, urticaria, angioedema  Physical exam:  Pertinent or positive findings: Facies are somewhat blank and answers are choppy and almost staccato.  She exhibits intermittent both esotropia as well as exotropia of the left eye.  The bridge of her nose is deformed.  She has complete dentures.  Abdomen slightly protuberant.  DIP arthritic changes are present.  There is a small boss over the mid right dorsal wrist.  General appearance: Adequately nourished; no acute distress, increased work of breathing is present.   Lymphatic: No lymphadenopathy about the head, neck, axilla. Eyes: No conjunctival inflammation or lid edema is present. There is no scleral icterus. Ears:  External ear exam shows no significant lesions or deformities.   Nose:  External nasal examination shows no deformity or inflammation. Nasal mucosa are pink and moist without lesions, exudates Oral exam: Lips and gums are healthy appearing.There is no oropharyngeal erythema or exudate. Neck:  No thyromegaly, masses, tenderness noted.    Heart:  Normal rate and regular rhythm. S1 and S2 normal without gallop, murmur, click, rub.  Lungs: Chest clear to auscultation without wheezes, rhonchi, rales, rubs. Abdomen: Bowel sounds are normal.  Abdomen is soft and nontender with no organomegaly, hernias, masses. GU: Deferred  Extremities:  No cyanosis, clubbing, edema. Neurologic exam:  Strength equal  in upper & lower extremities. Balance, Rhomberg, finger to nose testing could not be completed due to clinical state Deep tendon reflexes are equal Skin: Warm & dry w/o tenting. No significant lesions or rash.  See clinical summary under each active problem in the Problem List with associated updated therapeutic plan

## 2019-10-08 NOTE — Assessment & Plan Note (Addendum)
Creatinine 1.57, GFR 31 compatible with stage IIIb CKD. Avoid potentially nephrotoxic drugs such as Metformin

## 2019-10-08 NOTE — Assessment & Plan Note (Addendum)
B12 and iron supplementation with monitoring of CBC

## 2019-10-08 NOTE — Patient Instructions (Signed)
See assessment and plan under each diagnosis in the problem list and acutely for this visit 

## 2019-10-08 NOTE — Assessment & Plan Note (Signed)
Orthopedic follow-up with Dr. Romeo Apple approximately 2 weeks postop.  Staple removal 10/17/2019.

## 2019-10-10 DIAGNOSIS — K5909 Other constipation: Secondary | ICD-10-CM | POA: Insufficient documentation

## 2019-10-10 DIAGNOSIS — E1122 Type 2 diabetes mellitus with diabetic chronic kidney disease: Secondary | ICD-10-CM | POA: Insufficient documentation

## 2019-10-10 DIAGNOSIS — R339 Retention of urine, unspecified: Secondary | ICD-10-CM | POA: Insufficient documentation

## 2019-10-10 DIAGNOSIS — E538 Deficiency of other specified B group vitamins: Secondary | ICD-10-CM | POA: Insufficient documentation

## 2019-10-10 DIAGNOSIS — S72002D Fracture of unspecified part of neck of left femur, subsequent encounter for closed fracture with routine healing: Secondary | ICD-10-CM | POA: Insufficient documentation

## 2019-10-10 NOTE — Assessment & Plan Note (Signed)
Oral B12 with F/U by PCP

## 2019-10-14 ENCOUNTER — Encounter: Payer: Self-pay | Admitting: Adult Health

## 2019-10-14 ENCOUNTER — Non-Acute Institutional Stay (SKILLED_NURSING_FACILITY): Payer: Medicare Other | Admitting: Adult Health

## 2019-10-14 DIAGNOSIS — E1122 Type 2 diabetes mellitus with diabetic chronic kidney disease: Secondary | ICD-10-CM | POA: Diagnosis not present

## 2019-10-14 DIAGNOSIS — E1121 Type 2 diabetes mellitus with diabetic nephropathy: Secondary | ICD-10-CM | POA: Diagnosis not present

## 2019-10-14 DIAGNOSIS — S72002D Fracture of unspecified part of neck of left femur, subsequent encounter for closed fracture with routine healing: Secondary | ICD-10-CM | POA: Diagnosis not present

## 2019-10-14 DIAGNOSIS — N183 Chronic kidney disease, stage 3 unspecified: Secondary | ICD-10-CM

## 2019-10-14 DIAGNOSIS — N1832 Chronic kidney disease, stage 3b: Secondary | ICD-10-CM | POA: Diagnosis not present

## 2019-10-14 DIAGNOSIS — Z794 Long term (current) use of insulin: Secondary | ICD-10-CM

## 2019-10-14 NOTE — Progress Notes (Signed)
Location:    [facility] Room Number: 153/P Place of Service:  SNF (31)   CODE STATUS: Full Code  No Known Allergies  Chief Complaint  Patient presents with  . 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     HPI:  She is a 79 year old short term rehab patient being seen for the management of her chronic illnesses: left femoral neck fracture; ckd stages; and diabetes. She is having left hip pain however; she is not utilizing her prn tylenol. No changes in her appetite. No reports of agitation or anxiety.   Past Medical History:  Diagnosis Date  . Diabetes mellitus without complication (HCC)   . Hypertension     Past Surgical History:  Procedure Laterality Date  . CATARACT EXTRACTION    . HIP ARTHROPLASTY Left 10/03/2019   Procedure: ARTHROPLASTY BIPOLAR HIP (HEMIARTHROPLASTY);  Surgeon: Vickki Hearing, MD;  Location: [facility];  Service: Orthopedics;  Laterality: Left;  . TRACHEOSTOMY     when she was born  . TUBAL LIGATION      Social History   Socioeconomic History  . Marital status: Married    Spouse name: Not on file  . Number of children: Not on file  . Years of education: Not on file  . Highest education level: Not on file  Occupational History  . Not on file  Tobacco Use  . Smoking status: Never Smoker  . Smokeless tobacco: Never Used  Substance and Sexual Activity  . Alcohol use: Never  . Drug use: Never  . Sexual activity: Not on file  Other Topics Concern  . Not on file  Social History Narrative  . Not on file   Social Determinants of Health   Financial Resource Strain:   . Difficulty of Paying Living Expenses:   Food Insecurity:     . Worried About Programme researcher, broadcasting/film/video in the Last Year:   . Barista in the Last Year:   Transportation Needs:   . Freight forwarder (Medical):   Marland Kitchen Lack of Transportation (Non-Medical):   Physical Activity:   . Days of Exercise per Week:   . Minutes of Exercise per Session:   Stress:   . Feeling of Stress :   Social Connections:   . Frequency of Communication with Friends and Family:   . Frequency of Social Gatherings with Friends and Family:   . Attends Religious Services:   . Active Member of Clubs or Organizations:   . Attends Banker Meetings:   Marland Kitchen Marital Status:   Intimate Partner Violence:   . Fear of Current or Ex-Partner:   . Emotionally Abused:   Marland Kitchen Physically Abused:   . Sexually Abused:    No family history on file.    VITAL SIGNS BP (!) 142/74   Pulse 70   Temp 98.6 F (37 C) (Oral)   Resp 20   Ht 5\' 3"  (1.6 m)   Wt 136 lb 12.8 oz (62.1 kg)   BMI 24.23 kg/m   Outpatient Encounter Medications as of 10/14/2019  Medication Sig  .  acetaminophen (TYLENOL) 325 MG tablet Take 650 mg by mouth every 6 (six) hours as needed.  Marland Kitchen amLODipine (NORVASC) 10 MG tablet Take 10 mg by mouth daily.  . cyanocobalamin 1000 MCG tablet Take 2,000 mcg by mouth daily.  Marland Kitchen enoxaparin (LOVENOX) 40 MG/0.4ML injection Inject 0.4 mLs (40 mg total) into the skin daily. For 28 days  . Glucerna (GLUCERNA) LIQD Take 237 mLs by mouth 2 (two) times daily between meals.  . insulin glargine (LANTUS) 100 UNIT/ML injection Inject 0.1 mLs (10 Units total) into the skin daily.  . Iron, Ferrous Sulfate, 325 (65 Fe) MG TABS Take 1 tablet by mouth daily.  . metoprolol succinate (TOPROL-XL) 100 MG 24 hr tablet Take 100 mg by mouth daily. Take with or immediately following a meal.  . NON FORMULARY Diet: _____ Regular,---X--- Liquid Regular ___X__ NAS, ____X___Consistent Carbohydrate, _______NPO _____Other  . ondansetron (ZOFRAN) 4 MG tablet Take 4 mg by mouth every 8 (eight)  hours as needed for nausea or vomiting.  . polyethylene glycol (MIRALAX / GLYCOLAX) 17 g packet Take 17 g by mouth daily as needed for mild constipation.  . sitaGLIPtin (JANUVIA) 50 MG tablet Take 50 mg by mouth daily.  . tamsulosin (FLOMAX) 0.4 MG CAPS capsule Take 1 capsule (0.4 mg total) by mouth daily after supper.  . [DISCONTINUED] glimepiride (AMARYL) 2 MG tablet Take 1 tablet (2 mg total) by mouth daily with breakfast.  . [DISCONTINUED] traMADol (ULTRAM) 50 MG tablet Take 1 tablet (50 mg total) by mouth every 6 (six) hours.   No facility-administered encounter medications on file as of 10/14/2019.     SIGNIFICANT DIAGNOSTIC EXAMS  PREVIOUS  10-02-19: ct of head:  1. No acute intracranial abnormality. 2. Atrophy and chronic microvascular ischemic changes. 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  10-02-19: left hip x-ray:  1. Acute mildly displaced and impacted left femoral neck fracture. 2. Possible acute nondisplaced fracture of the left greater trochanter.  10-02-19: chest x-ray; No active disease   NO NEW EXAMS.   LABS REVIEWED PREVIOUS  10-02-19: wbc 13.2; hgb 11.9; hct 37.8; mcv 81.1 plt 335; glucose 236; bun 39; creat 1.87; k+ 4.3; na++ 135; ca 9.1 liver normal albumin 4.2 hgb a1c 10.1 10-04-19: wbc 10.2; hgb 10.7; hct 33.0; mcv 79.1 plt 292; glucose 317; bun 31; creat 1.57; k+ 4.0; na++ 135; ca 8.6 10-05-19: ferritin 72; iron 14; tibc 233; vit B 12: 233 folate 5.3   NO NEW LABS.    Review of Systems  Constitutional: Negative for malaise/fatigue.  Respiratory: Negative for cough and shortness of breath.   Cardiovascular: Negative for chest pain, palpitations and leg swelling.  Gastrointestinal: Negative for abdominal pain, constipation and heartburn.  Musculoskeletal: Positive for joint pain. Negative for back pain and myalgias.       Left hip pain   Skin: Negative.   Neurological: Negative for dizziness.  Psychiatric/Behavioral: The patient is not  nervous/anxious.    Physical Exam Constitutional:      General: She is not in acute distress.    Appearance: She is well-developed. She is not diaphoretic.  Eyes:     Comments:  History of cataract removal    Neck:     Thyroid: No thyromegaly.  Cardiovascular:     Rate and Rhythm: Normal rate and regular rhythm.     Pulses: Normal pulses.     Heart sounds: Normal heart sounds.  Pulmonary:     Effort: Pulmonary effort is normal. No respiratory distress.  Breath sounds: Normal breath sounds.  Abdominal:     General: Bowel sounds are normal. There is no distension.     Palpations: Abdomen is soft.     Tenderness: There is no abdominal tenderness.  Musculoskeletal:     Cervical back: Neck supple.     Right lower leg: No edema.     Left lower leg: No edema.     Comments: Is able to move all extremities  Is status post left hip hemiarthroplasty 10-03-19   Lymphadenopathy:     Cervical: No cervical adenopathy.  Skin:    General: Skin is warm and dry.  Neurological:     Mental Status: She is alert and oriented to person, place, and time.  Psychiatric:        Mood and Affect: Mood normal.      ASSESSMENT/ PLAN:  TODAY  1. Closed displaced fracture of left femoral neck with routine healing: is stable will continue therapy as directed and will follow up with orthopedics. Will change to tylenol cr 650 mg every 8 hours will monitor   2. Stage 3 chronic renal impairment associated with type 2 diabetes mellitus is stable will continue bun 31; creat 1.57   3. 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 Hypertension associated with stage 3 chronic kidney disease due to type 2 diabetes mellitus: is stable b/p 110/50 is stable will continue norvasc 10 mg daily and toprol xl 100 mg daily   5. Acute postoperative anemia due to  expected blood loss/microcytic hypochromic anemia : is stable hgb 10.7; vit B 12: 144: will begin vit B 12 supplement  will monitor   6. Urine retention: is stable will continue flomax 0.4 mg daily   7. Chronic constipation: is worse will change to miralax daily on a routine basis.     MD is aware of resident's narcotic use and is in agreement with current plan of care. We will attempt to wean resident as appropriate.  Ok Edwards NP [facility]  Contact [phone] Monday through Friday 8am- 5pm  After hours call [phone]

## 2019-10-15 ENCOUNTER — Telehealth: Payer: Self-pay | Admitting: Orthopedic Surgery

## 2019-10-15 NOTE — Telephone Encounter (Signed)
[place] from [facility] called and said her instructions were to remove pt's staples on the 27th.  Susan Mcgee has an appointment here on the 28th so her question is, do you still want her to remove the staples on Thursday the 27th or wait until patient is seen in the office on Friday the 28th?  Please advise [place] at the [facility] number is [phone]  Thanks

## 2019-10-15 NOTE — Telephone Encounter (Signed)
I called [place] to advise. She voiced understanding

## 2019-10-15 NOTE — Telephone Encounter (Signed)
27th

## 2019-10-16 ENCOUNTER — Encounter: Payer: Self-pay | Admitting: Adult Health

## 2019-10-16 ENCOUNTER — Non-Acute Institutional Stay (SKILLED_NURSING_FACILITY): Payer: Medicare Other | Admitting: Adult Health

## 2019-10-16 DIAGNOSIS — S72002D Fracture of unspecified part of neck of left femur, subsequent encounter for closed fracture with routine healing: Secondary | ICD-10-CM

## 2019-10-16 DIAGNOSIS — D509 Iron deficiency anemia, unspecified: Secondary | ICD-10-CM | POA: Diagnosis not present

## 2019-10-16 DIAGNOSIS — E1122 Type 2 diabetes mellitus with diabetic chronic kidney disease: Secondary | ICD-10-CM

## 2019-10-16 DIAGNOSIS — N183 Chronic kidney disease, stage 3 unspecified: Secondary | ICD-10-CM

## 2019-10-16 NOTE — Progress Notes (Signed)
Location:    [facility] Room Number: 153/P Place of Service:  SNF (31)   CODE STATUS: Full Code  No Known Allergies  Chief Complaint  Patient presents with  . Acute Visit    Care Plan Meeting    HPI:  We have come together for her care plan meeting. Family is present. Her goal is to return home with family with the eventual return to [place]. She is walking 20-30 feet with a walker. She has poor vision; there are no reports of falls. She needs to go up 3 steps. She will need 24 hour care upon discharge. She denies any pain; no changes in appetite. No reports of agitation. She continues to be followed for her chronic illnesses including: anemia; femoral neck fracture ckd stage 3.   Past Medical History:  Diagnosis Date  . Diabetes mellitus without complication (HCC)   . Hypertension     Past Surgical History:  Procedure Laterality Date  . CATARACT EXTRACTION    . HIP ARTHROPLASTY Left 10/03/2019   Procedure: ARTHROPLASTY BIPOLAR HIP (HEMIARTHROPLASTY);  Surgeon: Vickki Hearing, MD;  Location: [facility];  Service: Orthopedics;  Laterality: Left;  . TRACHEOSTOMY     when she was born  . TUBAL LIGATION      Social History   Socioeconomic History  . Marital status: Married    Spouse name: Not on file  . Number of children: Not on file  . Years of education: Not on file  . Highest education level: Not on file  Occupational History  . Not on file  Tobacco Use  . Smoking status: Never Smoker  . Smokeless tobacco: Never Used  Substance and Sexual Activity  . Alcohol use: Never  . Drug use: Never  . Sexual activity: Not on file  Other Topics Concern  . Not on file  Social History Narrative  . Not on file   Social Determinants of Health   Financial Resource Strain:   . Difficulty of Paying Living Expenses:   Food Insecurity:   . Worried About Programme researcher, broadcasting/film/video in the Last Year:   . Barista in the Last Year:   Transportation  Needs:   . Freight forwarder (Medical):   Marland Kitchen Lack of Transportation (Non-Medical):   Physical Activity:   . Days of Exercise per Week:   . Minutes of Exercise per Session:   Stress:   . Feeling of Stress :   Social Connections:   . Frequency of Communication with Friends and Family:   . Frequency of Social Gatherings with Friends and Family:   . Attends Religious Services:   . Active Member of Clubs or Organizations:   . Attends Banker Meetings:   Marland Kitchen Marital Status:   Intimate Partner Violence:   . Fear of Current or Ex-Partner:   . Emotionally Abused:   Marland Kitchen Physically Abused:   . Sexually Abused:    No family history on file.    VITAL SIGNS BP 104/60   Pulse (!) 54   Temp 98.9 F (37.2 C) (Oral)   Resp 18   Ht 5\' 3"  (1.6 m)   Wt 136 lb 12.8 oz (62.1 kg)   BMI 24.23 kg/m   Outpatient Encounter Medications as of 10/16/2019  Medication Sig  . acetaminophen (TYLENOL) 650 MG CR tablet Take 650 mg by mouth every 8 (eight) hours.  10/18/2019 amLODipine (NORVASC) 10 MG tablet Take 10 mg by mouth  daily.  . cyanocobalamin 1000 MCG tablet Take 2,000 mcg by mouth daily.  Marland Kitchen enoxaparin (LOVENOX) 40 MG/0.4ML injection Inject 0.4 mLs (40 mg total) into the skin daily. For 28 days  . Glucerna (GLUCERNA) LIQD Take 237 mLs by mouth 2 (two) times daily between meals.  . insulin glargine (LANTUS) 100 UNIT/ML injection Inject 0.1 mLs (10 Units total) into the skin daily.  . Iron, Ferrous Sulfate, 325 (65 Fe) MG TABS Take 1 tablet by mouth daily.  . metoprolol succinate (TOPROL-XL) 100 MG 24 hr tablet Take 100 mg by mouth daily. Take with or immediately following a meal.  . NON FORMULARY Diet: _____ Regular,---X--- Liquid Regular ___X__ NAS, ____X___Consistent Carbohydrate, _______NPO _____Other  . ondansetron (ZOFRAN) 4 MG tablet Take 4 mg by mouth every 8 (eight) hours as needed for nausea or vomiting.  . polyethylene glycol (MIRALAX / GLYCOLAX) 17 g packet Take 17 g by mouth  daily.  . sitaGLIPtin (JANUVIA) 50 MG tablet Take 50 mg by mouth daily.  . tamsulosin (FLOMAX) 0.4 MG CAPS capsule Take 1 capsule (0.4 mg total) by mouth daily after supper.  . [DISCONTINUED] acetaminophen (TYLENOL) 325 MG tablet Take 650 mg by mouth every 6 (six) hours as needed.  . [DISCONTINUED] polyethylene glycol (MIRALAX / GLYCOLAX) 17 g packet Take 17 g by mouth daily as needed for mild constipation.   No facility-administered encounter medications on file as of 10/16/2019.     SIGNIFICANT DIAGNOSTIC EXAMS   PREVIOUS  10-02-19: ct of head:  1. No acute intracranial abnormality. 2. Atrophy and chronic microvascular ischemic changes. 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  10-02-19: left hip x-ray:  1. Acute mildly displaced and impacted left femoral neck fracture. 2. Possible acute nondisplaced fracture of the left greater trochanter.  10-02-19: chest x-ray; No active disease   NO NEW EXAMS.   LABS REVIEWED PREVIOUS  10-02-19: wbc 13.2; hgb 11.9; hct 37.8; mcv 81.1 plt 335; glucose 236; bun 39; creat 1.87; k+ 4.3; na++ 135; ca 9.1 liver normal albumin 4.2 hgb a1c 10.1 10-04-19: wbc 10.2; hgb 10.7; hct 33.0; mcv 79.1 plt 292; glucose 317; bun 31; creat 1.57; k+ 4.0; na++ 135; ca 8.6 10-05-19: ferritin 72; iron 14; tibc 233; vit B 12: 233 folate 5.3   NO NEW LABS.   Review of Systems  Constitutional: Negative for malaise/fatigue.  Respiratory: Negative for cough and shortness of breath.   Cardiovascular: Negative for chest pain, palpitations and leg swelling.  Gastrointestinal: Negative for abdominal pain, constipation and heartburn.  Musculoskeletal: Negative for back pain, joint pain and myalgias.  Skin: Negative.   Neurological: Negative for dizziness.  Psychiatric/Behavioral: The patient is not nervous/anxious.     Physical Exam Constitutional:      General: She is not in acute distress.    Appearance: She is well-developed. She is not diaphoretic.  Eyes:       Comments: History of cataract removal   Neck:     Thyroid: No thyromegaly.  Cardiovascular:     Rate and Rhythm: Normal rate and regular rhythm.     Pulses: Normal pulses.     Heart sounds: Normal heart sounds.  Pulmonary:     Effort: Pulmonary effort is normal. No respiratory distress.     Breath sounds: Normal breath sounds.  Abdominal:     General: Bowel sounds are normal. There is no distension.     Palpations: Abdomen is soft.     Tenderness: There is no abdominal tenderness.  Musculoskeletal:     Cervical back: Neck supple.     Right lower leg: No edema.     Left lower leg: No edema.     Comments: Is able to move all extremities  Is status post left hip hemiarthroplasty 10-03-19    Lymphadenopathy:     Cervical: No cervical adenopathy.  Skin:    General: Skin is warm and dry.  Neurological:     Mental Status: She is alert and oriented to person, place, and time.  Psychiatric:        Mood and Affect: Mood normal.       ASSESSMENT/ PLAN:  TODAY  1. Closed displaced fracture of left femoral neck with routine healing 2. Microcytic hypochromic anemia 3. Stage 3 chronic renal impairment due to type 2 diabetes mellitus  Will continue therapy as directed Will continue current medications Her goal is to return home with family Will continue to monitor her status.   MD is aware of resident's narcotic use and is in agreement with current plan of care. We will attempt to wean resident as appropriate.  Synthia Innocent NP [facility]  Contact [phone] Monday through Friday 8am- 5pm  After hours call [phone]

## 2019-10-17 ENCOUNTER — Encounter: Payer: Self-pay | Admitting: Adult Health

## 2019-10-17 ENCOUNTER — Non-Acute Institutional Stay (SKILLED_NURSING_FACILITY): Payer: Medicare Other | Admitting: Adult Health

## 2019-10-17 ENCOUNTER — Other Ambulatory Visit (HOSPITAL_COMMUNITY)
Admission: RE | Admit: 2019-10-17 | Discharge: 2019-10-17 | Disposition: A | Discharge status: Home / Self Care | Payer: Medicare Other | Source: Skilled Nursing Facility | Attending: Adult Health | Admitting: Adult Health

## 2019-10-17 DIAGNOSIS — N183 Chronic kidney disease, stage 3 unspecified: Secondary | ICD-10-CM

## 2019-10-17 DIAGNOSIS — Z794 Long term (current) use of insulin: Secondary | ICD-10-CM

## 2019-10-17 DIAGNOSIS — N1832 Chronic kidney disease, stage 3b: Secondary | ICD-10-CM

## 2019-10-17 DIAGNOSIS — E1122 Type 2 diabetes mellitus with diabetic chronic kidney disease: Secondary | ICD-10-CM | POA: Diagnosis not present

## 2019-10-17 DIAGNOSIS — E1121 Type 2 diabetes mellitus with diabetic nephropathy: Secondary | ICD-10-CM | POA: Diagnosis not present

## 2019-10-17 LAB — BASIC METABOLIC PANEL
Anion gap: 13 (ref 5–15)
BUN: 50 mg/dL — ABNORMAL HIGH (ref 8–23)
CO2: 23 mmol/L (ref 22–32)
Calcium: 9 mg/dL (ref 8.9–10.3)
Chloride: 99 mmol/L (ref 98–111)
Creatinine, Ser: 2.3 mg/dL — ABNORMAL HIGH (ref 0.44–1.00)
GFR calc Af Amer: 23 mL/min — ABNORMAL LOW (ref >60)
GFR calc non Af Amer: 20 mL/min — ABNORMAL LOW (ref >60)
Glucose, Bld: 182 mg/dL — ABNORMAL HIGH (ref 70–99)
Potassium: 4.9 mmol/L (ref 3.5–5.1)
Sodium: 135 mmol/L (ref 135–145)

## 2019-10-17 NOTE — Progress Notes (Signed)
Location:    [facility] Room Number: 153/P Place of Service:  SNF (31)   CODE STATUS: Full Code  No Known Allergies  Chief Complaint  Patient presents with  . Follow-up    Follow Up Labs    HPI:  Her renal function is slightly worse with a creat of 2.30. she is presently taking januvia 50 mg daily . She denies any uncontrolled pain; no changes in her appetite; no reports of anxiety no reports of constipation.   Past Medical History:  Diagnosis Date  . Diabetes mellitus without complication ([place])   . Hypertension     Past Surgical History:  Procedure Laterality Date  . CATARACT EXTRACTION    . HIP ARTHROPLASTY Left 10/03/2019   Procedure: ARTHROPLASTY BIPOLAR HIP (HEMIARTHROPLASTY);  Surgeon: Carole Civil, MD;  Location: [facility];  Service: Orthopedics;  Laterality: Left;  . TRACHEOSTOMY     when she was born  . TUBAL LIGATION      Social History   Socioeconomic History  . Marital status: Married    Spouse name: Not on file  . Number of children: Not on file  . Years of education: Not on file  . Highest education level: Not on file  Occupational History  . Not on file  Tobacco Use  . Smoking status: Never Smoker  . Smokeless tobacco: Never Used  Substance and Sexual Activity  . Alcohol use: Never  . Drug use: Never  . Sexual activity: Not on file  Other Topics Concern  . Not on file  Social History Narrative  . Not on file   Social Determinants of Health   Financial Resource Strain:   . Difficulty of Paying Living Expenses:   Food Insecurity:   . Worried About Charity fundraiser in the Last Year:   . Arboriculturist in the Last Year:   Transportation Needs:   . Film/video editor (Medical):   Marland Kitchen Lack of Transportation (Non-Medical):   Physical Activity:   . Days of Exercise per Week:   . Minutes of Exercise per Session:   Stress:   . Feeling of Stress :   Social Connections:   . Frequency of Communication with  Friends and Family:   . Frequency of Social Gatherings with Friends and Family:   . Attends Religious Services:   . Active Member of Clubs or Organizations:   . Attends Archivist Meetings:   Marland Kitchen Marital Status:   Intimate Partner Violence:   . Fear of Current or Ex-Partner:   . Emotionally Abused:   Marland Kitchen Physically Abused:   . Sexually Abused:    No family history on file.    VITAL SIGNS BP 104/60   Pulse (!) 54   Temp 98.9 F (37.2 C) (Oral)   Resp 18   Ht 5\' 3"  (1.6 m)   Wt 136 lb 12.8 oz (62.1 kg)   BMI 24.23 kg/m   Outpatient Encounter Medications as of 10/17/2019  Medication Sig  . acetaminophen (TYLENOL) 650 MG CR tablet Take 650 mg by mouth every 8 (eight) hours.  Marland Kitchen amLODipine (NORVASC) 10 MG tablet Take 10 mg by mouth daily.  . cyanocobalamin 1000 MCG tablet Take 2,000 mcg by mouth daily.  Marland Kitchen enoxaparin (LOVENOX) 40 MG/0.4ML injection Inject 0.4 mLs (40 mg total) into the skin daily. For 28 days  . Glucerna (GLUCERNA) LIQD Take 237 mLs by mouth 2 (two) times daily between meals.  . insulin  glargine (LANTUS) 100 UNIT/ML injection Inject 0.1 mLs (10 Units total) into the skin daily.  . Iron, Ferrous Sulfate, 325 (65 Fe) MG TABS Take 1 tablet by mouth daily.  . metoprolol succinate (TOPROL-XL) 100 MG 24 hr tablet Take 100 mg by mouth daily. Take with or immediately following a meal.  . NON FORMULARY Diet: _____ Regular,---X--- Liquid Regular ___X__ NAS, ____X___Consistent Carbohydrate, _______NPO _____Other  . ondansetron (ZOFRAN) 4 MG tablet Take 4 mg by mouth every 8 (eight) hours as needed for nausea or vomiting.  . polyethylene glycol (MIRALAX / GLYCOLAX) 17 g packet Take 17 g by mouth daily.  . sitaGLIPtin (JANUVIA) 50 MG tablet Take 50 mg by mouth daily.  . tamsulosin (FLOMAX) 0.4 MG CAPS capsule Take 1 capsule (0.4 mg total) by mouth daily after supper.   No facility-administered encounter medications on file as of 10/17/2019.     SIGNIFICANT  DIAGNOSTIC EXAMS  PREVIOUS  10-02-19: ct of head:  1. No acute intracranial abnormality. 2. Atrophy and chronic microvascular ischemic changes. 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  10-02-19: left hip x-ray:  1. Acute mildly displaced and impacted left femoral neck fracture. 2. Possible acute nondisplaced fracture of the left greater trochanter.  10-02-19: chest x-ray; No active disease   NO NEW EXAMS.   LABS REVIEWED PREVIOUS  10-02-19: wbc 13.2; hgb 11.9; hct 37.8; mcv 81.1 plt 335; glucose 236; bun 39; creat 1.87; k+ 4.3; na++ 135; ca 9.1 liver normal albumin 4.2 hgb a1c 10.1 10-04-19: wbc 10.2; hgb 10.7; hct 33.0; mcv 79.1 plt 292; glucose 317; bun 31; creat 1.57; k+ 4.0; na++ 135; ca 8.6 10-05-19: ferritin 72; iron 14; tibc 233; vit B 12: 233 folate 5.3   TODAY 10-17-19: glucose 182; bun 50; creat 2.30; k+ 4.9; na++ 135; ca 9.0   Review of Systems  Constitutional: Negative for malaise/fatigue.  Respiratory: Negative for cough and shortness of breath.   Cardiovascular: Negative for chest pain, palpitations and leg swelling.  Gastrointestinal: Negative for abdominal pain, constipation and heartburn.  Musculoskeletal: Negative for back pain, joint pain and myalgias.  Skin: Negative.   Neurological: Negative for dizziness.  Psychiatric/Behavioral: The patient is not nervous/anxious.      Physical Exam Constitutional:      General: She is not in acute distress.    Appearance: She is well-developed. She is not diaphoretic.  Neck:     Thyroid: No thyromegaly.  Cardiovascular:     Rate and Rhythm: Normal rate and regular rhythm.     Pulses: Normal pulses.     Heart sounds: Normal heart sounds.  Pulmonary:     Effort: Pulmonary effort is normal. No respiratory distress.     Breath sounds: Normal breath sounds.  Abdominal:     General: Bowel sounds are normal. There is no distension.     Palpations: Abdomen is soft.     Tenderness: There is no abdominal tenderness.    Musculoskeletal:     Cervical back: Neck supple.     Right lower leg: No edema.     Left lower leg: No edema.     Comments: Is able to move all extremities  Is status post left hip hemiarthroplasty 10-03-19   Lymphadenopathy:     Cervical: No cervical adenopathy.  Skin:    General: Skin is warm and dry.  Neurological:     Mental Status: She is alert and oriented to person, place, and time.  Psychiatric:  Mood and Affect: Mood normal.       ASSESSMENT/ PLAN:  TODAY  1. Stage 3 chronic kidney disease due to type 2 diabetes mellitus 2. 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   MD is aware of resident's narcotic use and is in agreement with current plan of care. We will attempt to wean resident as appropriate.  Synthia Innocent NP [facility]  Contact [phone] Monday through Friday 8am- 5pm  After hours call [phone]

## 2019-10-18 ENCOUNTER — Ambulatory Visit (INDEPENDENT_AMBULATORY_CARE_PROVIDER_SITE_OTHER): Payer: Medicare Other | Admitting: Orthopedic Surgery

## 2019-10-18 ENCOUNTER — Encounter: Payer: Self-pay | Admitting: Orthopedic Surgery

## 2019-10-18 DIAGNOSIS — Z96642 Presence of left artificial hip joint: Secondary | ICD-10-CM

## 2019-10-18 NOTE — Progress Notes (Signed)
Chief Complaint  Patient presents with  . Routine Post Op    10/03/19 left hip surgery / bipolar replacement   . Wound Check    incision / staples steristrips in place, no redness drainage or irritation noted    2 weeks after bipolar replacement left hip for left femoral neck fracture.  She says she is doing very well.  She has a fairly good get up and go test  Her wound looks good  Her leg lengths are equal hip rotation is normal  Active hip flexion is 120 degrees without pain  Recommend physical therapy follow-up in 6 weeks  Encounter Diagnosis  Name Primary?  . S/P hip replacement, left Yes

## 2019-10-22 ENCOUNTER — Non-Acute Institutional Stay (SKILLED_NURSING_FACILITY): Payer: Medicare Other | Admitting: Adult Health

## 2019-10-22 ENCOUNTER — Other Ambulatory Visit: Payer: Self-pay | Admitting: Adult Health

## 2019-10-22 ENCOUNTER — Encounter: Payer: Self-pay | Admitting: Adult Health

## 2019-10-22 DIAGNOSIS — I129 Hypertensive chronic kidney disease with stage 1 through stage 4 chronic kidney disease, or unspecified chronic kidney disease: Secondary | ICD-10-CM

## 2019-10-22 DIAGNOSIS — D509 Iron deficiency anemia, unspecified: Secondary | ICD-10-CM | POA: Diagnosis not present

## 2019-10-22 DIAGNOSIS — E1122 Type 2 diabetes mellitus with diabetic chronic kidney disease: Secondary | ICD-10-CM | POA: Diagnosis not present

## 2019-10-22 DIAGNOSIS — N183 Chronic kidney disease, stage 3 unspecified: Secondary | ICD-10-CM

## 2019-10-22 DIAGNOSIS — S72002D Fracture of unspecified part of neck of left femur, subsequent encounter for closed fracture with routine healing: Secondary | ICD-10-CM | POA: Diagnosis not present

## 2019-10-22 MED ORDER — ONDANSETRON HCL 4 MG PO TABS: 4.0000 mg | ORAL_TABLET | Freq: Three times a day (TID) | ORAL | 0 refills | Status: AC | PRN

## 2019-10-22 MED ORDER — METOPROLOL SUCCINATE ER 100 MG PO TB24: 100.0000 mg | ORAL_TABLET | Freq: Every day | ORAL | 0 refills | Status: AC

## 2019-10-22 MED ORDER — ENOXAPARIN SODIUM 40 MG/0.4ML ~~LOC~~ SOLN: 40.0000 mg | SUBCUTANEOUS | 0 refills | Status: DC

## 2019-10-22 MED ORDER — SITAGLIPTIN PHOSPHATE 50 MG PO TABS: 50.0000 mg | ORAL_TABLET | Freq: Every day | ORAL | 0 refills | Status: AC

## 2019-10-22 MED ORDER — AMLODIPINE BESYLATE 10 MG PO TABS: 10.0000 mg | ORAL_TABLET | Freq: Every day | ORAL | 0 refills | Status: AC

## 2019-10-22 MED ORDER — IRON (FERROUS SULFATE) 325 (65 FE) MG PO TABS: 1.0000 | ORAL_TABLET | Freq: Every day | ORAL | 0 refills | Status: AC

## 2019-10-22 MED ORDER — INSULIN GLARGINE 100 UNIT/ML ~~LOC~~ SOLN: 10.0000 [IU] | Freq: Every day | SUBCUTANEOUS | 0 refills | Status: DC

## 2019-10-22 MED ORDER — TAMSULOSIN HCL 0.4 MG PO CAPS: 0.4000 mg | ORAL_CAPSULE | Freq: Every day | ORAL | 0 refills | Status: DC

## 2019-10-22 NOTE — Progress Notes (Signed)
Location:    [facility] Room Number: 148/D Place of Service:  SNF (31)    CODE STATUS: Full Code  No Known Allergies  Chief Complaint  Patient presents with  . Discharge Note    Discharge Visit    HPI:  She is being discharged to home with home health for pt/ot she will need a front wheel walker. She will need her prescriptions written and will need to follow up with her medical provider. She had been hospitalized for a left femoral neck fracture. She was admitted to this facility for short term rehab. She has participated in therapy and is ready to complete therapy on a home health basis.    Past Medical History:  Diagnosis Date  . Diabetes mellitus without complication ([place])   . Hypertension     Past Surgical History:  Procedure Laterality Date  . CATARACT EXTRACTION    . HIP ARTHROPLASTY Left 10/03/2019   Procedure: ARTHROPLASTY BIPOLAR HIP (HEMIARTHROPLASTY);  Surgeon: Carole Civil, MD;  Location: [facility];  Service: Orthopedics;  Laterality: Left;  . TRACHEOSTOMY     when she was born  . TUBAL LIGATION      Social History   Socioeconomic History  . Marital status: Married    Spouse name: Not on file  . Number of children: Not on file  . Years of education: Not on file  . Highest education level: Not on file  Occupational History  . Not on file  Tobacco Use  . Smoking status: Never Smoker  . Smokeless tobacco: Never Used  Substance and Sexual Activity  . Alcohol use: Never  . Drug use: Never  . Sexual activity: Not on file  Other Topics Concern  . Not on file  Social History Narrative  . Not on file   Social Determinants of Health   Financial Resource Strain:   . Difficulty of Paying Living Expenses:   Food Insecurity:   . Worried About Charity fundraiser in the Last Year:   . Arboriculturist in the Last Year:   Transportation Needs:   . Film/video editor (Medical):   Marland Kitchen Lack of Transportation (Non-Medical):     Physical Activity:   . Days of Exercise per Week:   . Minutes of Exercise per Session:   Stress:   . Feeling of Stress :   Social Connections:   . Frequency of Communication with Friends and Family:   . Frequency of Social Gatherings with Friends and Family:   . Attends Religious Services:   . Active Member of Clubs or Organizations:   . Attends Archivist Meetings:   Marland Kitchen Marital Status:   Intimate Partner Violence:   . Fear of Current or Ex-Partner:   . Emotionally Abused:   Marland Kitchen Physically Abused:   . Sexually Abused:    No family history on file.  VITAL SIGNS BP 130/68   Pulse 75   Temp (!) 97.1 F (36.2 C) (Oral)   Resp 20   Ht 5\' 3"  (1.6 m)   Wt 136 lb 12.8 oz (62.1 kg)   BMI 24.23 kg/m   Patient's Medications  New Prescriptions   No medications on file  Previous Medications   ACETAMINOPHEN (TYLENOL) 650 MG CR TABLET    Take 650 mg by mouth every 8 (eight) hours.   AMLODIPINE (NORVASC) 10 MG TABLET    Take 10 mg by mouth daily.   CYANOCOBALAMIN 1000 MCG TABLET  Take 2,000 mcg by mouth daily.   ENOXAPARIN (LOVENOX) 40 MG/0.4ML INJECTION    Inject 0.4 mLs (40 mg total) into the skin daily. For 28 days   GLUCERNA (GLUCERNA) LIQD    Take 237 mLs by mouth 2 (two) times daily between meals.   INSULIN GLARGINE (LANTUS) 100 UNIT/ML INJECTION    Inject 0.1 mLs (10 Units total) into the skin daily.   IRON, FERROUS SULFATE, 325 (65 FE) MG TABS    Take 1 tablet by mouth daily.   METOPROLOL SUCCINATE (TOPROL-XL) 100 MG 24 HR TABLET    Take 100 mg by mouth daily. Take with or immediately following a meal.   NON FORMULARY    Diet: _____ Regular,---X--- Liquid Regular ___X__ NAS, ____X___Consistent Carbohydrate, _______NPO _____Other   ONDANSETRON (ZOFRAN) 4 MG TABLET    Take 4 mg by mouth every 8 (eight) hours as needed for nausea or vomiting.   POLYETHYLENE GLYCOL (MIRALAX / GLYCOLAX) 17 G PACKET    Take 17 g by mouth daily.   SITAGLIPTIN (JANUVIA) 50 MG TABLET     Take 50 mg by mouth daily.   TAMSULOSIN (FLOMAX) 0.4 MG CAPS CAPSULE    Take 1 capsule (0.4 mg total) by mouth daily after supper.  Modified Medications   No medications on file  Discontinued Medications   No medications on file     SIGNIFICANT DIAGNOSTIC EXAMS   PREVIOUS  10-02-19: ct of head:  1. No acute intracranial abnormality. 2. Atrophy and chronic microvascular ischemic changes. 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  10-02-19: left hip x-ray:  1. Acute mildly displaced and impacted left femoral neck fracture. 2. Possible acute nondisplaced fracture of the left greater trochanter.  10-02-19: chest x-ray; No active disease   NO NEW EXAMS.   LABS REVIEWED PREVIOUS  10-02-19: wbc 13.2; hgb 11.9; hct 37.8; mcv 81.1 plt 335; glucose 236; bun 39; creat 1.87; k+ 4.3; na++ 135; ca 9.1 liver normal albumin 4.2 hgb a1c 10.1 10-04-19: wbc 10.2; hgb 10.7; hct 33.0; mcv 79.1 plt 292; glucose 317; bun 31; creat 1.57; k+ 4.0; na++ 135; ca 8.6 10-05-19: ferritin 72; iron 14; tibc 233; vit B 12: 233 folate 5.3  10-17-19: glucose 182; bun 50; creat 2.30; k+ 4.9; na++ 135; ca 9.0   NO NEW LABS.   Review of Systems  Constitutional: Negative for malaise/fatigue.  Respiratory: Negative for cough and shortness of breath.   Cardiovascular: Negative for chest pain, palpitations and leg swelling.  Gastrointestinal: Negative for abdominal pain, constipation and heartburn.  Musculoskeletal: Negative for back pain, joint pain and myalgias.  Skin: Negative.   Neurological: Negative for dizziness.  Psychiatric/Behavioral: The patient is not nervous/anxious.     Physical Exam Constitutional:      General: She is not in acute distress.    Appearance: She is well-developed. She is not diaphoretic.  Neck:     Thyroid: No thyromegaly.  Cardiovascular:     Rate and Rhythm: Normal rate and regular rhythm.     Pulses: Normal pulses.     Heart sounds: Normal heart sounds.  Pulmonary:      Effort: Pulmonary effort is normal. No respiratory distress.     Breath sounds: Normal breath sounds.  Abdominal:     General: Bowel sounds are normal. There is no distension.     Palpations: Abdomen is soft.     Tenderness: There is no abdominal tenderness.  Musculoskeletal:     Cervical back: Neck supple.  Right lower leg: No edema.     Left lower leg: No edema.     Comments: Is able to move all extremities  Is status post left hip hemiarthroplasty 10-03-19    Lymphadenopathy:     Cervical: No cervical adenopathy.  Skin:    General: Skin is warm and dry.  Neurological:     Mental Status: She is alert and oriented to person, place, and time.  Psychiatric:        Mood and Affect: Mood normal.       ASSESSMENT/ PLAN:   Patient is being discharged with the following home health services:  Pt/ot to evaluate and treat as indicated for gait balance strength adl training.   Patient is being discharged with the following durable medical equipment:  Front wheel walker to allow her to maintain her current level of independence with her adls.   Patient has been advised to f/u with their PCP in 1-2 weeks to bring them up to date on their rehab stay.  Social services at facility was responsible for arranging this appointment.  Pt was provided with a 30 day supply of prescriptions for medications and refills must be obtained from their PCP.  For controlled substances, a more limited supply may be provided adequate until PCP appointment only.   A 30 day supply of her prescription medications have been sent to [facility] in [place]  Time spent with patient: 35 minutes: dme; medications home health    Synthia Innocent NP [facility]  Contact [phone] Monday through Friday 8am- 5pm  After hours call [phone]

## 2019-10-25 ENCOUNTER — Other Ambulatory Visit: Payer: Self-pay | Admitting: Adult Health

## 2019-10-25 ENCOUNTER — Telehealth: Payer: Self-pay | Admitting: Adult Health

## 2019-10-25 DIAGNOSIS — Z794 Long term (current) use of insulin: Secondary | ICD-10-CM

## 2019-10-25 NOTE — Telephone Encounter (Signed)
Notified by Alexa at the pharmacy that Lantus will not be covered by insurance for this pt. Change to Levemir flex pen 5 units bid 1 vial no refills. Pen needles also ordered.

## 2019-11-02 ENCOUNTER — Ambulatory Visit
Admission: EM | Admit: 2019-11-02 | Discharge: 2019-11-02 | Disposition: A | Discharge status: Home / Self Care | Payer: Medicare Other | Attending: Emergency Medicine | Admitting: Emergency Medicine

## 2019-11-02 ENCOUNTER — Other Ambulatory Visit: Payer: Self-pay

## 2019-11-02 DIAGNOSIS — R195 Other fecal abnormalities: Secondary | ICD-10-CM

## 2019-11-02 DIAGNOSIS — L97421 Non-pressure chronic ulcer of left heel and midfoot limited to breakdown of skin: Secondary | ICD-10-CM

## 2019-11-02 DIAGNOSIS — S91302A Unspecified open wound, left foot, initial encounter: Secondary | ICD-10-CM

## 2019-11-02 MED ORDER — CEPHALEXIN 500 MG PO CAPS
500.0000 mg | ORAL_CAPSULE | Freq: Two times a day (BID) | ORAL | 0 refills | Status: AC
Start: 2019-11-02 — End: 2019-11-12

## 2019-11-02 NOTE — ED Triage Notes (Signed)
Pt presents with pressure sore on left heel. Pt home from [facility] earlier this week

## 2019-11-02 NOTE — Discharge Instructions (Signed)
Bandage applied Keflex prescribed.  Take as directed and to completion Take OTC ibuprofen or tylenol as needed for pain relief  Follow up with wound management Return or go to the ED if you have any new or worsening symptoms such as increased pain, redness, swelling, drainage, discharge, etc..

## 2019-11-02 NOTE — ED Provider Notes (Signed)
[facility]   [id number] 11/02/19 Arrival Time: 5784  CC: Wound check  SUBJECTIVE:  Susan Mcgee is a 79 y.o. female hx significant for DM, and HTN, who presents with a wound to LT heel x 1 week.  Underwent hip replacement last month.  Recently left [facility].  Denies alleviating factors.  Tender to the touch.  Denies similar symptoms in the past.  Denies fever, chills, nausea, vomiting, redness, swelling, purulent drainage, decrease strength or sensation.   Care take also mentions 3 episodes of loose stools yesterday.  Had been constipated, now having bowel movement.  Was using miralax.  Denies antibiotic use or profuse watery stools.  Denies fever, chills, nausea, vomiting, blood in stool, melena, abdominal pain.    ROS: As per HPI.  All other pertinent ROS negative.     Past Medical History:  Diagnosis Date  . Diabetes mellitus without complication ([place])   . Hypertension    Past Surgical History:  Procedure Laterality Date  . CATARACT EXTRACTION    . HIP ARTHROPLASTY Left 10/03/2019   Procedure: ARTHROPLASTY BIPOLAR HIP (HEMIARTHROPLASTY);  Surgeon: Carole Civil, MD;  Location: [facility];  Service: Orthopedics;  Laterality: Left;  . TRACHEOSTOMY     when she was born  . TUBAL LIGATION     No Known Allergies No current facility-administered medications on file prior to encounter.   Current Outpatient Medications on File Prior to Encounter  Medication Sig Dispense Refill  . acetaminophen (TYLENOL) 650 MG CR tablet Take 650 mg by mouth every 8 (eight) hours.    Marland Kitchen amLODipine (NORVASC) 10 MG tablet Take 1 tablet (10 mg total) by mouth daily. 30 tablet 0  . cyanocobalamin 1000 MCG tablet Take 2,000 mcg by mouth daily.    . Glucerna (GLUCERNA) LIQD Take 237 mLs by mouth 2 (two) times daily between meals.    . insulin detemir (LEVEMIR) 100 UNIT/ML injection Inject 5 Units into the skin 2 (two) times daily.    . Iron, Ferrous Sulfate, 325 (65 Fe) MG TABS Take 1  tablet by mouth daily. 30 tablet 0  . metoprolol succinate (TOPROL-XL) 100 MG 24 hr tablet Take 1 tablet (100 mg total) by mouth daily. Take with or immediately following a meal. 30 tablet 0  . NON FORMULARY Diet: _____ Regular,---X--- Liquid Regular ___X__ NAS, ____X___Consistent Carbohydrate, _______NPO _____Other    . ondansetron (ZOFRAN) 4 MG tablet Take 1 tablet (4 mg total) by mouth every 8 (eight) hours as needed for nausea or vomiting. 20 tablet 0  . polyethylene glycol (MIRALAX / GLYCOLAX) 17 g packet Take 17 g by mouth daily.    . sitaGLIPtin (JANUVIA) 50 MG tablet Take 1 tablet (50 mg total) by mouth daily. 30 tablet 0  . tamsulosin (FLOMAX) 0.4 MG CAPS capsule Take 1 capsule (0.4 mg total) by mouth daily after supper. 30 capsule 0  . [DISCONTINUED] enoxaparin (LOVENOX) 40 MG/0.4ML injection Inject 0.4 mLs (40 mg total) into the skin daily for 6 days. For 28 days 2.5 mL 0   Social History   Socioeconomic History  . Marital status: Married    Spouse name: Not on file  . Number of children: Not on file  . Years of education: Not on file  . Highest education level: Not on file  Occupational History  . Not on file  Tobacco Use  . Smoking status: Never Smoker  . Smokeless tobacco: Never Used  Substance and Sexual Activity  . Alcohol use: Never  .  Drug use: Never  . Sexual activity: Not on file  Other Topics Concern  . Not on file  Social History Narrative  . Not on file   Social Determinants of Health   Financial Resource Strain:   . Difficulty of Paying Living Expenses:   Food Insecurity:   . Worried About Programme researcher, broadcasting/film/video in the Last Year:   . Barista in the Last Year:   Transportation Needs:   . Freight forwarder (Medical):   Marland Kitchen Lack of Transportation (Non-Medical):   Physical Activity:   . Days of Exercise per Week:   . Minutes of Exercise per Session:   Stress:   . Feeling of Stress :   Social Connections:   . Frequency of Communication  with Friends and Family:   . Frequency of Social Gatherings with Friends and Family:   . Attends Religious Services:   . Active Member of Clubs or Organizations:   . Attends Banker Meetings:   Marland Kitchen Marital Status:   Intimate Partner Violence:   . Fear of Current or Ex-Partner:   . Emotionally Abused:   Marland Kitchen Physically Abused:   . Sexually Abused:    History reviewed. No pertinent family history.   OBJECTIVE:  Vitals:   11/02/19 0920  BP: (!) 111/49  Pulse: 60  Resp: 18  Temp: 98.1 F (36.7 C)  SpO2: 97%     General appearance: alert; no distress Head: NCAT Eyes: EOMI grossly CV: cap refill < 2 seconds Skin: 3 cm ulcer to LT heel, appears black, no obvious break-down of skin, no bleeding, discharge, mildly TTP Strength: 5/5 with dorsi/plantar flexion Psychological: alert and cooperative; normal mood and affect  ASSESSMENT & PLAN:  1. Skin ulcer of left heel, limited to breakdown of skin (HCC)   2. Loose stools     Meds ordered this encounter  Medications  . cephALEXin (KEFLEX) 500 MG capsule    Sig: Take 1 capsule (500 mg total) by mouth 2 (two) times daily for 10 days.    Dispense:  20 capsule    Refill:  0    Order Specific Question:   Supervising Provider    Answer:   Eustace Moore [[id number]]   Bandage applied Keflex prescribed.  Take as directed and to completion Take OTC ibuprofen or tylenol as needed for pain relief  Follow up with wound management Return or go to the ED if you have any new or worsening symptoms such as increased pain, redness, swelling, drainage, discharge, etc..    Will try miralax to bulk stools May use imodium Currently not on antibiotics and denies blood in stools.  Low suspicion for GI bleed or c. Diff.   Instructed care giver to take to ED if symptoms worsened such as fever, chills, nausea, vomiting, perfuse watery stools, black or bloody stools, etc..   Reviewed expectations re: course of current medical issues.  Questions answered. Outlined signs and symptoms indicating need for more acute intervention. Patient verbalized understanding. After Visit Summary given.   Rennis Harding, PA-C 11/02/19 [phone]

## 2019-11-06 ENCOUNTER — Telehealth: Payer: Self-pay | Admitting: Orthopedic Surgery

## 2019-11-06 NOTE — Telephone Encounter (Addendum)
Dr Romeo Apple does not do wound care I called patient to give number for patient engagement, they will help patient establish primary care [phone] is the number if you need it for future reference.

## 2019-11-06 NOTE — Telephone Encounter (Signed)
We need to get [facility] assistance to take this patient as a primary care patient

## 2019-11-06 NOTE — Telephone Encounter (Signed)
Call from patient (post op hip fracture); spoke primarily with/granddaughter Susan Mcgee, who lives with patient and listed as contact. States has been discharged to home from [facility]. Relays new problem of "pressure wound" left heel. States came home from facility with it and said that facility has no documentation of it. States physical therapist from [facility] looked at it and said Dr's orders would be needed for them to come back to evaluate. Granddaughter states primary care would not accept Susan Mcgee as a patient. States went to [facility] in [place] also, however, have not provided orders. Please advise regarding scheduling and/or for this medical issue. Ph# [phone]

## 2019-11-07 NOTE — Telephone Encounter (Signed)
I have provided the number for her to call and get primary care, patient engagement center  This is what Susan Mcgee has advised me to do in the past.

## 2019-11-08 ENCOUNTER — Other Ambulatory Visit: Payer: Self-pay

## 2019-11-08 ENCOUNTER — Encounter (HOSPITAL_BASED_OUTPATIENT_CLINIC_OR_DEPARTMENT_OTHER): Payer: Medicare Other | Attending: Internal Medicine | Admitting: Internal Medicine

## 2019-11-08 DIAGNOSIS — Z96642 Presence of left artificial hip joint: Secondary | ICD-10-CM | POA: Diagnosis not present

## 2019-11-08 DIAGNOSIS — E1136 Type 2 diabetes mellitus with diabetic cataract: Secondary | ICD-10-CM | POA: Insufficient documentation

## 2019-11-08 DIAGNOSIS — E11621 Type 2 diabetes mellitus with foot ulcer: Secondary | ICD-10-CM | POA: Insufficient documentation

## 2019-11-08 DIAGNOSIS — E1140 Type 2 diabetes mellitus with diabetic neuropathy, unspecified: Secondary | ICD-10-CM | POA: Diagnosis not present

## 2019-11-08 DIAGNOSIS — N183 Chronic kidney disease, stage 3 unspecified: Secondary | ICD-10-CM | POA: Insufficient documentation

## 2019-11-08 DIAGNOSIS — I129 Hypertensive chronic kidney disease with stage 1 through stage 4 chronic kidney disease, or unspecified chronic kidney disease: Secondary | ICD-10-CM | POA: Insufficient documentation

## 2019-11-08 DIAGNOSIS — E1122 Type 2 diabetes mellitus with diabetic chronic kidney disease: Secondary | ICD-10-CM | POA: Diagnosis not present

## 2019-11-08 DIAGNOSIS — L89620 Pressure ulcer of left heel, unstageable: Secondary | ICD-10-CM | POA: Insufficient documentation

## 2019-11-08 DIAGNOSIS — L89629 Pressure ulcer of left heel, unspecified stage: Secondary | ICD-10-CM | POA: Diagnosis present

## 2019-11-08 NOTE — Progress Notes (Signed)
Susan Mcgee, Susan Mcgee ([id number]) Visit Report for 11/08/2019 Abuse/Suicide Risk Screen Details Patient Name: Date of Service: Susan Mcgee NICE 11/08/2019 1:15 PM Medical Record Number: [id number] Patient Account Number: [id number] Date of Birth/Sex: Treating RN: 28-Aug-1940 (79 y.o. Tommye Standard Primary Care Faustine Tates: PA Zenovia Jordan, [place] Other Clinician: Referring Krosby Ritchie: Treating Rida Loudin/Extender: Valentino Saxon in Treatment: 0 Abuse/Suicide Risk Screen Items Answer ABUSE RISK SCREEN: Has anyone close to you tried to hurt or harm you recentlyo No Do you feel uncomfortable with anyone in your familyo No Has anyone forced you do things that you didnt want to doo No Electronic Signature(s) Signed: 11/08/2019 4:42:48 PM By: Zenaida Deed RN, BSN Entered By: Zenaida Deed on 11/08/2019 14:10:32 -------------------------------------------------------------------------------- Activities of Daily Living Details Patient Name: Date of Service: Susan Mcgee NICE 11/08/2019 1:15 PM Medical Record Number: [id number] Patient Account Number: [id number] Date of Birth/Sex: Treating RN: 1940/12/09 (79 y.o. Tommye Standard Primary Care Quetzally Callas: PA Zenovia Jordan, [place] Other Clinician: Referring Daryus Sowash: Treating Yovan Leeman/Extender: Valentino Saxon in Treatment: 0 Activities of Daily Living Items Answer Activities of Daily Living (Please select one for each item) Drive Automobile Not Able T Medications ake Need Assistance Use T elephone Completely Able Care for Appearance Completely Able Use T oilet Completely Able Bath / Shower Completely Able Dress Self Need Assistance Feed Self Completely Able Walk Need Assistance Get In / Out Bed Completely Able Housework Need Assistance Prepare Meals Need Assistance Handle Money Completely Able Shop for Self Need Assistance Electronic Signature(s) Signed: 11/08/2019 4:42:48 PM By: Zenaida Deed RN, BSN Entered By: Zenaida Deed on  11/08/2019 14:12:19 -------------------------------------------------------------------------------- Education Screening Details Patient Name: Date of Service: Susan Mcgee NICE 11/08/2019 1:15 PM Medical Record Number: [id number] Patient Account Number: [id number] Date of Birth/Sex: Treating RN: Jul 26, 1940 (79 y.o. Tommye Standard Primary Care Sabrine Patchen: PA Zenovia Jordan, [place] Other Clinician: Referring Shannon Balthazar: Treating Deshan Hemmelgarn/Extender: Valentino Saxon in Treatment: 0 Primary Learner Assessed: Patient Learning Preferences/Education Level/Primary Language Learning Preference: Explanation, Demonstration, Printed Material Highest Education Level: High School Preferred Language: English Cognitive Barrier Language Barrier: No Translator Needed: No Memory Deficit: No Emotional Barrier: No Cultural/Religious Beliefs Affecting Medical Care: No Physical Barrier Impaired Vision: Yes double vision Impaired Hearing: No Decreased Hand dexterity: No Knowledge/Comprehension Knowledge Level: High Comprehension Level: High Ability to understand written instructions: High Ability to understand verbal instructions: High Motivation Anxiety Level: Calm Cooperation: Cooperative Education Importance: Acknowledges Need Interest in Health Problems: Asks Questions Perception: Coherent Willingness to Engage in Self-Management High Activities: Readiness to Engage in Self-Management High Activities: Electronic Signature(s) Signed: 11/08/2019 4:42:48 PM By: Zenaida Deed RN, BSN Entered By: Zenaida Deed on 11/08/2019 14:12:59 -------------------------------------------------------------------------------- Fall Risk Assessment Details Patient Name: Date of Service: Susan Mcgee NICE 11/08/2019 1:15 PM Medical Record Number: [id number] Patient Account Number: [id number] Date of Birth/Sex: Treating RN: 1941-04-26 (78 y.o. Tommye Standard Primary Care Maisy Newport: PA Zenovia Jordan, [place] Other  Clinician: Referring Kamry Faraci: Treating Marianny Goris/Extender: Valentino Saxon in Treatment: 0 Fall Risk Assessment Items Have you had 2 or more falls in the last 12 monthso 0 No Have you had any fall that resulted in injury in the last 12 monthso 0 Yes FALLS RISK SCREEN History of falling - immediate or within 3 months 25 Yes Secondary diagnosis (Do you have 2 or more medical diagnoseso) 0 No Ambulatory aid None/bed rest/wheelchair/nurse 0 No Crutches/cane/walker 15 Yes Furniture 0 No Intravenous therapy Access/Saline/Heparin Lock 0 No Gait/Transferring Normal/ bed rest/ wheelchair 0 No Weak (short steps with or without shuffle,  stooped but able to lift head while walking, may seek 10 Yes support from furniture) Impaired (short steps with shuffle, may have difficulty arising from chair, head down, impaired 0 No balance) Mental Status Oriented to own ability 0 Yes Electronic Signature(s) Signed: 11/08/2019 4:42:48 PM By: Baruch Gouty RN, BSN Entered By: Baruch Gouty on 11/08/2019 14:13:46 -------------------------------------------------------------------------------- Foot Assessment Details Patient Name: Date of Service: Susan Mcgee NICE 11/08/2019 1:15 PM Medical Record Number: [id number] Patient Account Number: [id number] Date of Birth/Sex: Treating RN: [birth year] (79 y.o. Elam Dutch Primary Care Mailee Klaas: PA [place], [place] Other Clinician: Referring Fatim Vanderschaaf: Treating Chloe Flis/Extender: Cheree Ditto in Treatment: 0 Foot Assessment Items Site Locations + = Sensation present, - = Sensation absent, C = Callus, U = Ulcer R = Redness, W = Warmth, M = Maceration, PU = Pre-ulcerative lesion F = Fissure, S = Swelling, D = Dryness Assessment Right: Left: Other Deformity: No No Prior Foot Ulcer: Yes No Prior Amputation: No No Charcot Joint: No No Ambulatory Status: Ambulatory With Help Assistance Device: Walker Gait: Steady Electronic  Signature(s) Signed: 11/08/2019 4:42:48 PM By: Baruch Gouty RN, BSN Entered By: Baruch Gouty on 11/08/2019 14:17:00 -------------------------------------------------------------------------------- Nutrition Risk Screening Details Patient Name: Date of Service: Susan Mcgee NICE 11/08/2019 1:15 PM Medical Record Number: [id number] Patient Account Number: [id number] Date of Birth/Sex: Treating RN: 1940-12-07 (79 y.o. Elam Dutch Primary Care Corda Shutt: PA Haig Prophet, [place] Other Clinician: Referring Marlee Armenteros: Treating Kavya Haag/Extender: Cheree Ditto in Treatment: 0 Height (in): 63 Weight (lbs): 141 Body Mass Index (BMI): 25 Nutrition Risk Screening Items Score Screening NUTRITION RISK SCREEN: I have an illness or condition that made me change the kind and/or amount of food I eat 0 No I eat fewer than two meals per day 0 No I eat few fruits and vegetables, or milk products 0 No I have three or more drinks of beer, liquor or wine almost every day 0 No I have tooth or mouth problems that make it hard for me to eat 0 No I don't always have enough money to buy the food I need 0 No I eat alone most of the time 0 No I take three or more different prescribed or over-the-counter drugs a day 1 Yes Without wanting to, I have lost or gained 10 pounds in the last six months 0 No I am not always physically able to shop, cook and/or feed myself 2 Yes Nutrition Protocols Good Risk Protocol Moderate Risk Protocol 0 Provide education on nutrition High Risk Proctocol Risk Level: Moderate Risk Score: 3 Electronic Signature(s) Signed: 11/08/2019 4:42:48 PM By: Baruch Gouty RN, BSN Entered By: Baruch Gouty on 11/08/2019 14:14:51

## 2019-11-08 NOTE — Progress Notes (Signed)
HALEY, FUERSTENBERG ([id number]) Visit Report for 11/08/2019 Chief Complaint Document Details Patient Name: Date of Service: Susan Mcgee NICE 11/08/2019 1:15 PM Medical Record Number: [id number] Patient Account Number: [id number] Date of Birth/Sex: Treating RN: 1940-10-19 (79 y.o. Harvest Dark Primary Care Provider: PA Zenovia Jordan, [place] Other Clinician: Referring Provider: Treating Provider/Extender: Valentino Saxon in Treatment: 0 Information Obtained from: Patient Chief Complaint 11/08/2019; patient is here for review of a injury on her tip of her left heel which is a pressure ulcer Electronic Signature(s) Signed: 11/08/2019 5:24:49 PM By: Baltazar Najjar MD Entered By: Baltazar Najjar on 11/08/2019 15:13:10 -------------------------------------------------------------------------------- Debridement Details Patient Name: Date of Service: Susan Mcgee NICE 11/08/2019 1:15 PM Medical Record Number: [id number] Patient Account Number: [id number] Date of Birth/Sex: Treating RN: 06/29/1940 (79 y.o. Harvest Dark Primary Care Provider: PA Zenovia Jordan, [place] Other Clinician: Referring Provider: Treating Provider/Extender: Valentino Saxon in Treatment: 0 Debridement Performed for Assessment: Wound #1 Left Calcaneus Performed By: Physician Maxwell Caul., MD Debridement Type: Debridement Level of Consciousness (Pre-procedure): Awake and Alert Pre-procedure Verification/Time Out Yes - 14:55 Taken: Start Time: 14:55 Pain Control: Other : benzocaine, 20% T Area Debrided (L x W): otal 3.8 (cm) x 4.3 (cm) = 16.34 (cm) Tissue and other material debrided: Viable, Non-Viable, Eschar, Muscle, Subcutaneous, Skin: Dermis Level: Skin/Subcutaneous Tissue/Muscle Debridement Description: Excisional Instrument: Blade, Curette, Forceps Bleeding: Minimum Hemostasis Achieved: Pressure End Time: 15:00 Procedural Pain: 0 Post Procedural Pain: 0 Response to Treatment: Procedure was  tolerated well Level of Consciousness (Post- Awake and Alert procedure): Post Debridement Measurements of Total Wound Length: (cm) 3.8 Stage: Unstageable/Unclassified Width: (cm) 4.3 Depth: (cm) 2.2 Volume: (cm) 28.233 Character of Wound/Ulcer Post Debridement: Requires Further Debridement Post Procedure Diagnosis Same as Pre-procedure Electronic Signature(s) Signed: 11/08/2019 5:07:15 PM By: Cherylin Mylar Signed: 11/08/2019 5:24:49 PM By: Baltazar Najjar MD Entered By: Baltazar Najjar on 11/08/2019 15:12:09 -------------------------------------------------------------------------------- HPI Details Patient Name: Date of Service: Susan Mcgee NICE 11/08/2019 1:15 PM Medical Record Number: [id number] Patient Account Number: [id number] Date of Birth/Sex: Treating RN: 1940-06-07 (79 y.o. Harvest Dark Primary Care Provider: PA Zenovia Jordan, [place] Other Clinician: Referring Provider: Treating Provider/Extender: Valentino Saxon in Treatment: 0 History of Present Illness HPI Description: Admission 11/08/2019 This is a 79 year old woman who is accompanied by her granddaughter. Recently moved here I believe early in May from [place] along with her husband to live with her granddaughter in [place]. This was predominantly because of illness related to her husband. In any case shortly after arrival here she fell and on 10/03/2019 she fractured her left hip and required a left hip replacement. She rehabilitated at the [facility] and was discharged home on 6/3. A day later or so the granddaughter noted a black area on her tip of her heel with the blistered area. They have not been able to get into a primary care physician's office in [place] but they do have physical therapy coming through [facility] home health. She was seen in the ER on 11/02/2019 noted to have a left heel pressure ulcer she was given Keflex 500 twice daily for 10 days and I think the appointment here was  arranged. 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 Clearnce Sorrel Primary Care Provider: PA Haig Prophet, [place] Other Clinician: Referring Provider: Treating Provider/Extender: Cheree Ditto in Treatment: 0 Constitutional Sitting or standing Blood Pressure is within target range for patient.. Pulse regular and within target range for patient.Marland Kitchen Respirations regular, non-labored and within target range.. Temperature is normal and within the target range for the patient.Marland Kitchen Appears in no distress. Respiratory work of breathing is normal. Cardiovascular Posterior tibial pulses are palpable on the left. Both her dorsalis pedis and posterior tibial pulses are thready but palpable.. Integumentary (Hair, Skin) No primary skin issues are seen. Psychiatric Somewhat of a flat affect. Notes Wound exam; she came in with a deep tissue injury and this is most likely how this started. Likely a blister with a deep tissue injury. In any case the skin was still intact over a black area in the center of this area. I had some thoughts about leaving this intact however I removed the surface skin with pickups and a #15 scalpel and then an extensive debridement of necrotic tissue down into the muscle layer of her heel. This did not probe to bone there was no purulence however obviously a deep full-thickness pressure injury. Electronic  Signature(s) Signed: 11/08/2019 5:24:49 PM By: Linton Ham MD Entered By: Linton Ham on 11/08/2019 15:19:02 -------------------------------------------------------------------------------- Physician Orders Details Patient Name: Date of Service: Sonnie Alamo NICE 11/08/2019 1:15 PM Medical Record Number: [id number] Patient Account Number: [id number] Date of Birth/Sex: Treating RN: 11-09-1940 (79 y.o. Clearnce Sorrel Primary Care Provider: PA Haig Prophet, [place] Other Clinician: Referring Provider: Treating Provider/Extender: Cheree Ditto in Treatment: 0 Verbal / Phone Orders: No Diagnosis Coding Follow-up Appointments Return Appointment in 1 week. Dressing Change Frequency Change Dressing every other day. - HH to change three times a week Wound Cleansing May shower and wash wound with soap and water. - with dressing change Primary Wound Dressing Wound #1 Left Calcaneus Silver Collagen Other: - moistened saline gauze over silver collagen Secondary Dressing Wound #1 Left Calcaneus Kerlix/Rolled Gauze Dry Gauze Heel Cup Off-Loading Other: - heel offloader Home Health dmit to [facility] for [facility] - For wound care, [facility] A Recertify for PT/ and or Continue PT [facility] ssociates Office - [facility]er to [facility] in [place] with Dr. Genevive Bi Refer to Surgical [facility] Electronic Signature(s) Signed: 11/08/2019 5:07:15 PM By: Kela Millin Signed: 11/08/2019 5:24:49 PM By: Linton Ham MD Entered By: Kela Millin on 11/08/2019 15:10:46 Prescription 11/08/2019 -------------------------------------------------------------------------------- Karolee Stamps MD Patient Name: Provider: [birth year] [id number] Date of Birth: NPI#Jesse Sans Sex: DEA #: [phone] [id number] Phone #: License #: [place] Patient Address: [place] [address] [place], [place] [address]  [place], [place] [address] [phone] Allergies Name Reaction Severity No Known Allergies Provider's Orders ssociates Office - Refer to [facility] in [place] with Dr. Genevive Bi Refer to [facility] Hand Signature: Date(s): Electronic Signature(s) Signed: 11/08/2019 5:07:15 PM By: Kela Millin Signed: 11/08/2019 5:24:49 PM By: Linton Ham MD Entered By: Kela Millin on 11/08/2019 15:10:47 -------------------------------------------------------------------------------- Problem List Details Patient Name: Date of Service: Sonnie Alamo NICE 11/08/2019 1:15 PM Medical Record Number: [id number] Patient Account Number: [id number] Date of Birth/Sex: Treating RN: 1940/06/15 (79 y.o. Clearnce Sorrel Primary Care Provider: PA Haig Prophet, [place] Other Clinician: Referring Provider: Treating Provider/Extender: Linton Ham  Weeks in Treatment: 0 Active Problems ICD-10 Encounter Code Description Active Date MDM Diagnosis L89.620 Pressure ulcer of left heel, unstageable 11/08/2019 No Yes E11.621 Type 2 diabetes mellitus with foot ulcer 11/08/2019 No Yes Inactive Problems Resolved Problems Electronic Signature(s) Signed: 11/08/2019 5:24:49 PM By: Baltazar Najjar MD Signed: 11/08/2019 5:24:49 PM By: Baltazar Najjar MD Entered By: Baltazar Najjar on 11/08/2019 15:11:33 -------------------------------------------------------------------------------- Progress Note Details Patient Name: Date of Service: Susan Mcgee NICE 11/08/2019 1:15 PM Medical Record Number: [id number] Patient Account Number: [id number] Date of Birth/Sex: Treating RN: Jan 13, 1941 (79 y.o. Harvest Dark Primary Care Provider: PA Zenovia Jordan, [place] Other Clinician: Referring Provider: Treating Provider/Extender: Valentino Saxon in Treatment: 0 Subjective Chief Complaint Information obtained from Patient 11/08/2019; patient is here for review of a injury on her tip of her left  heel which is a pressure ulcer History of Present Illness (HPI) Admission 11/08/2019 This is a 79 year old woman who is accompanied by her granddaughter. Recently moved here I believe early in May from [place] along with her husband to live with her granddaughter in [place]. This was predominantly because of illness related to her husband. In any case shortly after arrival here she fell and on 10/03/2019 she fractured her left hip and required a left hip replacement. She rehabilitated at the [facility] and was discharged home on 6/3. A day later or so the granddaughter noted a black area on her tip of her heel with the blistered area. They have not been able to get into a primary care physician's office in [place] but they do have physical therapy coming through [facility] home health. She was seen in the ER on 11/02/2019 noted to have a left heel pressure ulcer she was given Keflex 500 twice daily for 10 days and I think the appointment here was arranged. Past medical history includes type 2 diabetes, stage III chronic renal failure, hypertension, left hip fracture and a left total hip replacement on 10/03/2019 ABI on the left was 0.88 and her Patient History Information obtained from Patient. Allergies No Known Allergies Family History Cancer - Siblings, Diabetes - Mother, Heart Disease - Siblings, Hypertension - Mother,Siblings, Lung Disease - Siblings, Stroke - Siblings, No family history of Hereditary Spherocytosis, Kidney Disease, Seizures, Thyroid Problems, Tuberculosis. Social History Never smoker, Marital Status - Married, Alcohol Use - Never, Drug Use - No History, Caffeine Use - Daily - soda. Medical History Eyes Patient has history of Cataracts - right eye removed, left eye current Denies history of Glaucoma, Optic Neuritis Hematologic/Lymphatic Patient has history of Anemia Cardiovascular Patient has history of Hypertension Endocrine Patient has history of Type II  Diabetes Denies history of Type I Diabetes Genitourinary Denies history of End Stage Renal Disease Integumentary (Skin) Denies history of History of Burn Neurologic Patient has history of Neuropathy Oncologic Denies history of Received Chemotherapy, Received Radiation Psychiatric Denies history of Anorexia/bulimia, Confinement Anxiety Patient is treated with Insulin. Blood sugar is tested. Hospitalization/Surgery History - left hip replacement. - right cataract extraction. - tubal ligation. - tracheostomy as baby. Medical A Surgical History Notes nd Eyes double vision intermittant Genitourinary CKD Review of Systems (ROS) Constitutional Symptoms (General Health) Denies complaints or symptoms of Fatigue, Fever, Chills, Marked Weight Change. Ear/Nose/Mouth/Throat Denies complaints or symptoms of Chronic sinus problems or rhinitis. Respiratory Denies complaints or symptoms of Chronic or frequent coughs, Shortness of Breath. Gastrointestinal Denies complaints or symptoms of Frequent diarrhea, Nausea, Vomiting. Genitourinary Denies complaints or symptoms of Frequent urination. Integumentary (Skin) Complains or has  symptoms of Wounds - left heel. Musculoskeletal Denies complaints or symptoms of Muscle Pain, Muscle Weakness. Neurologic Complains or has symptoms of Numbness/parasthesias - feet. Psychiatric Denies complaints or symptoms of Claustrophobia, Suicidal. Objective Constitutional Sitting or standing Blood Pressure is within target range for patient.. Pulse regular and within target range for patient.Marland Kitchen Respirations regular, non-labored and within target range.. Temperature is normal and within the target range for the patient.Marland Kitchen Appears in no distress. Vitals Time Taken: 1:49 PM, Height: 63 in, Source: Stated, Weight: 141 lbs, Source: Stated, BMI: 25, Temperature: 97.9 F, Pulse: 59 bpm, Respiratory Rate: 18 breaths/min, Blood Pressure: 130/66 mmHg, Capillary Blood  Glucose: 174 mg/dl. General Notes: glucose per pt report this am Respiratory work of breathing is normal. Cardiovascular Posterior tibial pulses are palpable on the left. Both her dorsalis pedis and posterior tibial pulses are thready but palpable.Marland Kitchen Psychiatric Somewhat of a flat affect. General Notes: Wound exam; she came in with a deep tissue injury and this is most likely how this started. Likely a blister with a deep tissue injury. In any case the skin was still intact over a black area in the center of this area. I had some thoughts about leaving this intact however I removed the surface skin with pickups and a #15 scalpel and then an extensive debridement of necrotic tissue down into the muscle layer of her heel. This did not probe to bone there was no purulence however obviously a deep full-thickness pressure injury. Integumentary (Hair, Skin) No primary skin issues are seen. Wound #1 status is Open. Original cause of wound was Pressure Injury. The wound is located on the Left Calcaneus. The wound measures 3.8cm length x 4.3cm width x 0.1cm depth; 12.833cm^2 area and 1.283cm^3 volume. There is Fat Layer (Subcutaneous Tissue) Exposed exposed. There is no tunneling or undermining noted. There is a small amount of serosanguineous drainage noted. The wound margin is flat and intact. There is no granulation within the wound bed. There is a large (67-100%) amount of necrotic tissue within the wound bed including Eschar. Assessment Active Problems ICD-10 Pressure ulcer of left heel, unstageable Type 2 diabetes mellitus with foot ulcer Procedures Wound #1 Pre-procedure diagnosis of Wound #1 is a Pressure Ulcer located on the Left Calcaneus . There was a Excisional Skin/Subcutaneous Tissue/Muscle Debridement with a total area of 16.34 sq cm performed by Maxwell Caul., MD. With the following instrument(s): Blade, Curette, and Forceps to remove Viable and Non-Viable tissue/material.  Material removed includes Muscle, Eschar, Subcutaneous Tissue, and Skin: Dermis after achieving pain control using Other (benzocaine, 20%). No specimens were taken. A time out was conducted at 14:55, prior to the start of the procedure. A Minimum amount of bleeding was controlled with Pressure. The procedure was tolerated well with a pain level of 0 throughout and a pain level of 0 following the procedure. Post Debridement Measurements: 3.8cm length x 4.3cm width x 2.2cm depth; 28.233cm^3 volume. Post debridement Stage noted as Unstageable/Unclassified. Character of Wound/Ulcer Post Debridement requires further debridement. Post procedure Diagnosis Wound #1: Same as Pre-Procedure Plan Follow-up Appointments: Return Appointment in 1 week. Dressing Change Frequency: Change Dressing every other day. - HH to change three times a week Wound Cleansing: May shower and wash wound with soap and water. - with dressing change Primary Wound Dressing: Wound #1 Left Calcaneus: Silver Collagen Other: - moistened saline gauze over silver collagen Secondary Dressing: Wound #1 Left Calcaneus: Kerlix/Rolled Gauze Dry Gauze Heel Cup Off-Loading: Other: - heel offloader Home Health: Admit  to Home Health for Skilled Nursing - For wound care, [facility] Recertify for PT/ and or Continue PT ordered were: [facility] - [facility] to [facility] in [place] with Dr. Thelma Bargeaks 1. After at reasonably deep debridement I was able to get the healthy tissue no bone. Removed fat and muscle all necrotic. 2. Primary dressing silver collagen border foam kerlix and gauze. Change every 2 3. Would be glad to sign the patient's home health nursing orders in the absence of a primary doctor 4. I saw no evidence of infection here 5. This did not probe to bone and the skin was still intact I did not culture this 6. The ABI in our clinic was 0.88 however the pulses in her foot were somewhat anemic.  I went ahead and ordered formal arterial studies to include arterial Dopplers TBI's and ABIs 7. Overwhelmed granddaughter who is looking after 2 disabled grandparents. [place]. I brought up the clinic in [place] run by Dr. Inez Catalinaakes and we will try to arrange to get them followed there. I spent 35 minutes on review of this patient's record face-to-face evaluation and creation of this record Electronic Signature(s) Signed: 11/08/2019 5:24:49 PM By: Baltazar Najjarobson, Shadoe Cryan MD Entered By: Baltazar Najjarobson, Lakeisha Waldrop on 11/08/2019 15:20:54 -------------------------------------------------------------------------------- HxROS Details Patient Name: Date of Service: Susan PinksPENNINGTO N, JA NICE 11/08/2019 1:15 PM Medical Record Number: [id number] Patient Account Number: [id number] Date of Birth/Sex: Treating RN: 12/12/1940 (79 y.o. Tommye StandardF) Boehlein, Linda Primary Care Provider: PA Zenovia JordanIENT, [place] Other Clinician: Referring Provider: Treating Provider/Extender: Valentino Saxonobson, Larraine Argo Weeks in Treatment: 0 Information Obtained From Patient Constitutional Symptoms (General Health) Complaints and Symptoms: Negative for: Fatigue; Fever; Chills; Marked Weight Change Ear/Nose/Mouth/Throat Complaints and Symptoms: Negative for: Chronic sinus problems or rhinitis Respiratory Complaints and Symptoms: Negative for: Chronic or frequent coughs; Shortness of Breath Gastrointestinal Complaints and Symptoms: Negative for: Frequent diarrhea; Nausea; Vomiting Genitourinary Complaints and Symptoms: Negative for: Frequent urination Medical History: Negative for: End Stage Renal Disease Past Medical History Notes: CKD Integumentary (Skin) Complaints and Symptoms: Positive for: Wounds - left heel Medical History: Negative for: History of Burn Musculoskeletal Complaints and Symptoms: Negative for: Muscle Pain; Muscle Weakness Neurologic Complaints and Symptoms: Positive for: Numbness/parasthesias - feet Medical History: Positive  for: Neuropathy Psychiatric Complaints and Symptoms: Negative for: Claustrophobia; Suicidal Medical History: Negative for: Anorexia/bulimia; Confinement Anxiety Eyes Medical History: Positive for: Cataracts - right eye removed, left eye current Negative for: Glaucoma; Optic Neuritis Past Medical History Notes: double vision intermittant Hematologic/Lymphatic Medical History: Positive for: Anemia Cardiovascular Medical History: Positive for: Hypertension Endocrine Medical History: Positive for: Type II Diabetes Negative for: Type I Diabetes Time with diabetes: 6 yrs Treated with: Insulin Blood sugar tested every day: Yes Tested : 2 times per day Immunological Oncologic Medical History: Negative for: Received Chemotherapy; Received Radiation HBO Extended History Items Eyes: Cataracts Immunizations Pneumococcal Vaccine: Received Pneumococcal Vaccination: No Implantable Devices Yes Hospitalization / Surgery History Type of Hospitalization/Surgery left hip replacement right cataract extraction tubal ligation tracheostomy as baby Family and Social History Cancer: Yes - Siblings; Diabetes: Yes - Mother; Heart Disease: Yes - Siblings; Hereditary Spherocytosis: No; Hypertension: Yes - Mother,Siblings; Kidney Disease: No; Lung Disease: Yes - Siblings; Seizures: No; Stroke: Yes - Siblings; Thyroid Problems: No; Tuberculosis: No; Never smoker; Marital Status - Married; Alcohol Use: Never; Drug Use: No History; Caffeine Use: Daily - soda; Financial Concerns: No; Food, Clothing or Shelter Needs: No; Support System Lacking: No; Transportation Concerns: No Electronic Signature(s) Signed: 11/08/2019 4:42:48 PM  By: Zenaida Deed RN, BSN Signed: 11/08/2019 5:24:49 PM By: Baltazar Najjar MD Entered By: Zenaida Deed on 11/08/2019 14:10:20 -------------------------------------------------------------------------------- SuperBill Details Patient Name: Date of Service: Susan Mcgee NICE 11/08/2019 Medical Record Number: [id number] Patient Account Number: [id number] Date of Birth/Sex: Treating RN: July 08, 1940 (79 y.o. Harvest Dark Primary Care Provider: PA Zenovia Jordan, [place] Other Clinician: Referring Provider: Treating Provider/Extender: Valentino Saxon in Treatment: 0 Diagnosis Coding ICD-10 Codes Code Description [id number] Pressure ulcer of left heel, unstageable E11.621 Type 2 diabetes mellitus with foot ulcer Facility Procedures CPT4 Code: [id number] Description: 99213 - WOUND CARE VISIT-LEV 3 EST PT Modifier: 25 Quantity: 1 CPT4 Code: [id number] Description: 11043 - DEB MUSC/FASCIA 20 SQ CM/< ICD-10 Diagnosis Description L89.620 Pressure ulcer of left heel, unstageable Modifier: Quantity: 1 Physician Procedures : CPT4 Code Description Modifier [id number] WC PHYS LEVEL 3 NEW PT 25 ICD-10 Diagnosis Description L89.620 Pressure ulcer of left heel, unstageable E11.621 Type 2 diabetes mellitus with foot ulcer Quantity: 1 : [id number] 11043 - WC PHYS DEBR MUSCLE/FASCIA 20 SQ CM 1 ICD-10 Diagnosis Description L89.620 Pressure ulcer of left heel, unstageable Quantity: Electronic Signature(s) Signed: 11/08/2019 5:24:49 PM By: Baltazar Najjar MD Entered By: Baltazar Najjar on 11/08/2019 15:21:22

## 2019-11-08 NOTE — Progress Notes (Addendum)
ANBERLYN, FEIMSTER ([id number]) Visit Report for 11/08/2019 Allergy List Details Patient Name: Date of Service: Susan Mcgee NICE 11/08/2019 1:15 PM Medical Record Number: [id number] Patient Account Number: [id number] Date of Birth/Sex: Treating RN: 12-03-1940 (79 y.o. Female) Zenaida Deed Primary Care Lekita Kerekes: PA Zenovia Jordan, [place] Other Clinician: Referring Damarian Priola: Treating Alayjah Boehringer/Extender: Valentino Saxon in Treatment: 0 Allergies Active Allergies No Known Allergies Allergy Notes Electronic Signature(s) Signed: 11/08/2019 4:42:48 PM By: Zenaida Deed RN, BSN Entered By: Zenaida Deed on 11/08/2019 13:55:27 -------------------------------------------------------------------------------- Arrival Information Details Patient Name: Date of Service: Susan Mcgee NICE 11/08/2019 1:15 PM Medical Record Number: [id number] Patient Account Number: [id number] Date of Birth/Sex: Treating RN: [birth year] (79 y.o. Female) Zenaida Deed Primary Care Jnaya Butrick: PA Zenovia Jordan, [place] Other Clinician: Referring Sherryll Skoczylas: Treating Cael Worth/Extender: Valentino Saxon in Treatment: 0 Visit Information Patient Arrived: Wheel Chair Arrival Time: 13:43 Accompanied By: granddgt Transfer Assistance: None Patient Identification Verified: Yes Secondary Verification Process Completed: Yes Patient Requires Transmission-Based Precautions: No Patient Has Alerts: No Electronic Signature(s) Signed: 11/08/2019 4:42:48 PM By: Zenaida Deed RN, BSN Entered By: Zenaida Deed on 11/08/2019 13:47:35 -------------------------------------------------------------------------------- Clinic Level of Care Assessment Details Patient Name: Date of Service: Susan Mcgee NICE 11/08/2019 1:15 PM Medical Record Number: [id number] Patient Account Number: [id number] Date of Birth/Sex: Treating RN: 08/01/1940 (79 y.o. Female) Cherylin Mylar Primary Care Trevious Rampey: PA Zenovia Jordan, [place] Other Clinician: Referring  Helayne Metsker: Treating Sarahelizabeth Conway/Extender: Valentino Saxon in Treatment: 0 Clinic Level of Care Assessment Items TOOL 1 Quantity Score X- 1 0 Use when EandM and Procedure is performed on INITIAL visit ASSESSMENTS - Nursing Assessment / Reassessment X- 1 20 General Physical Exam (combine w/ comprehensive assessment (listed just below) when performed on new pt. evals) X- 1 25 Comprehensive Assessment (HX, ROS, Risk Assessments, Wounds Hx, etc.) ASSESSMENTS - Wound and Skin Assessment / Reassessment []  - 0 Dermatologic / Skin Assessment (not related to wound area) ASSESSMENTS - Ostomy and/or Continence Assessment and Care []  - 0 Incontinence Assessment and Management []  - 0 Ostomy Care Assessment and Management (repouching, etc.) PROCESS - Coordination of Care X - Simple Patient / Family Education for ongoing care 1 15 []  - 0 Complex (extensive) Patient / Family Education for ongoing care X- 1 10 Staff obtains , Records, T Results / Process Orders est X- 1 10 Staff telephones HHA, Nursing Homes / Clarify orders / etc []  - 0 Routine Transfer to another Facility (non-emergent condition) []  - 0 Routine Hospital Admission (non-emergent condition) X- 1 15 New Admissions / / Ordering NPWT Apligraf, etc. , []  - 0 Emergency Hospital Admission (emergent condition) PROCESS - Special Needs []  - 0 Pediatric / Minor Patient Management []  - 0 Isolation Patient Management []  - 0 Hearing / Language / Visual special needs []  - 0 Assessment of Community assistance (transportation, D/C planning, etc.) []  - 0 Additional assistance / Altered mentation []  - 0 Support Surface(s) Assessment (bed, cushion, seat, etc.) INTERVENTIONS - Miscellaneous []  - 0 External ear exam []  - 0 Patient Transfer (multiple staff / / Similar devices) []  - 0 Simple Staple / Suture removal (25 or less) []  - 0 Complex Staple / Suture removal (26 or more) []  -  0 Hypo/Hyperglycemic Management (do not check if billed separately) X- 1 15 Ankle / Brachial Index (ABI) - do not check if billed separately Has the patient been seen at the hospital within the last three years: Yes Total Score: 110 Level Of Care: New/Established - Level 3 Electronic Signature(s)  Signed: 11/08/2019 5:07:15 PM By: Cherylin Mylar Entered By: Cherylin Mylar on 11/08/2019 14:59:11 -------------------------------------------------------------------------------- Encounter Discharge Information Details Patient Name: Date of Service: Susan Mcgee NICE 11/08/2019 1:15 PM Medical Record Number: [id number] Patient Account Number: [id number] Date of Birth/Sex: Treating RN: 02-22-1941 (79 y.o. Female) Shawn Stall Primary Care Tina Gruner: PA Zenovia Jordan, [place] Other Clinician: Referring Jolie Strohecker: Treating Abiha Lukehart/Extender: Valentino Saxon in Treatment: 0 Encounter Discharge Information Items Post Procedure Vitals Discharge Condition: Stable Temperature (F): 97.9 Ambulatory Status: Wheelchair Pulse (bpm): 59 Discharge Destination: Home Respiratory Rate (breaths/min): 18 Transportation: Private Auto Blood Pressure (mmHg): 130/66 Accompanied By: granddaughter Schedule Follow-up Appointment: Yes Clinical Summary of Care: Electronic Signature(s) Signed: 11/08/2019 5:30:12 PM By: Shawn Stall Entered By: Shawn Stall on 11/08/2019 15:45:43 -------------------------------------------------------------------------------- Lower Extremity Assessment Details Patient Name: Date of Service: Susan Mcgee NICE 11/08/2019 1:15 PM Medical Record Number: [id number] Patient Account Number: [id number] Date of Birth/Sex: Treating RN: 08/22/1940 (79 y.o. Female) Zenaida Deed Primary Care Osmin Welz: PA Zenovia Jordan, [place] Other Clinician: Referring Kearra Calkin: Treating Lamoyne Palencia/Extender: Valentino Saxon in Treatment: 0 Vascular Assessment Blood Pressure: Brachial:  [Left:130] Dorsalis Pedis: 114 Ankle: [Left:Posterior Tibial: 88 0.88] Electronic Signature(s) Signed: 11/08/2019 4:42:48 PM By: Zenaida Deed RN, BSN Entered By: Zenaida Deed on 11/08/2019 14:21:37 -------------------------------------------------------------------------------- Multi Wound Chart Details Patient Name: Date of Service: Susan Mcgee NICE 11/08/2019 1:15 PM Medical Record Number: [id number] Patient Account Number: [id number] Date of Birth/Sex: Treating RN: July 16, 1940 (79 y.o. Female) Cherylin Mylar Primary Care Lalah Durango: PA Zenovia Jordan, [place] Other Clinician: Referring Aidah Forquer: Treating Narciso Stoutenburg/Extender: Valentino Saxon in Treatment: 0 Vital Signs Height(in): 63 Capillary Blood Glucose(mg/dl): 035 Weight(lbs): 465 Pulse(bpm): 59 Body Mass Index(BMI): 25 Blood Pressure(mmHg): 130/66 Temperature(F): 97.9 Respiratory Rate(breaths/min): 18 Photos: [1:No Photos Left Calcaneus] [N/A:N/A N/A] Wound Location: [1:Pressure Injury] [N/A:N/A] Wounding Event: [1:Pressure Ulcer] [N/A:N/A] Primary Etiology: [1:Cataracts, Anemia, Hypertension,] [N/A:N/A] Comorbid History: [1:Type II Diabetes, Neuropathy 10/22/2019] [N/A:N/A] Date Acquired: [1:0] [N/A:N/A] Weeks of Treatment: [1:Open] [N/A:N/A] Wound Status: [1:3.8x4.3x0.1] [N/A:N/A] Measurements L x W x D (cm) [1:12.833] [N/A:N/A] A (cm) : rea [1:1.283] [N/A:N/A] Volume (cm) : [1:Unstageable/Unclassified] [N/A:N/A] Classification: [1:Small] [N/A:N/A] Exudate A mount: [1:Serosanguineous] [N/A:N/A] Exudate Type: [1:red, brown] [N/A:N/A] Exudate Color: [1:Flat and Intact] [N/A:N/A] Wound Margin: [1:None Present (0%)] [N/A:N/A] Granulation A mount: [1:Large (67-100%)] [N/A:N/A] Necrotic A mount: [1:Eschar] [N/A:N/A] Necrotic Tissue: [1:Fat Layer (Subcutaneous Tissue)] [N/A:N/A] Exposed Structures: [1:Exposed: Yes Fascia: No Tendon: No Muscle: No Joint: No Bone: No Large (67-100%)] [N/A:N/A] Epithelialization:  [1:Debridement - Excisional] [N/A:N/A] Debridement: Pre-procedure Verification/Time Out 14:55 [N/A:N/A] Taken: [1:Other] [N/A:N/A] Pain Control: [1:Necrotic/Eschar, Subcutaneous] [N/A:N/A] Tissue Debrided: [1:Skin/Subcutaneous Tissue] [N/A:N/A] Level: [1:16.34] [N/A:N/A] Debridement A (sq cm): [1:rea Blade, Curette, Forceps] [N/A:N/A] Instrument: [1:Minimum] [N/A:N/A] Bleeding: [1:Pressure] [N/A:N/A] Hemostasis A chieved: [1:0] [N/A:N/A] Procedural Pain: [1:0] [N/A:N/A] Post Procedural Pain: [1:Procedure was tolerated well] [N/A:N/A] Debridement Treatment Response: [1:3.8x4.3x2.2] [N/A:N/A] Post Debridement Measurements L x W x D (cm) [1:28.233] [N/A:N/A] Post Debridement Volume: (cm) [1:Unstageable/Unclassified] [N/A:N/A] Post Debridement Stage: [1:Debridement] [N/A:N/A] Treatment Notes Electronic Signature(s) Signed: 11/08/2019 5:07:15 PM By: Cherylin Mylar Signed: 11/08/2019 5:24:49 PM By: Baltazar Najjar MD Entered By: Baltazar Najjar on 11/08/2019 15:11:44 -------------------------------------------------------------------------------- Multi-Disciplinary Care Plan Details Patient Name: Date of Service: Susan Mcgee NICE 11/08/2019 1:15 PM Medical Record Number: [id number] Patient Account Number: [id number] Date of Birth/Sex: Treating RN: [birth year] (79 y.o. Female) Cherylin Mylar Primary Care Krist Rosenboom: PA Zenovia Jordan, [place] Other Clinician: Referring Kamerin Axford: Treating Trigg Delarocha/Extender: Valentino Saxon in Treatment: 0 Active Inactive Electronic Signature(s) Signed: 11/14/2019 5:29:15 PM By: Cherylin Mylar Previous Signature: 11/08/2019 5:07:15 PM  Version By: Cherylin Mylar Entered By: Cherylin Mylar on 11/12/2019 17:18:52 -------------------------------------------------------------------------------- Pain Assessment Details Patient Name: Date of Service: Susan Mcgee NICE 11/08/2019 1:15 PM Medical Record Number: [id number] Patient Account Number:  [id number] Date of Birth/Sex: Treating RN: 1941/01/29 (79 y.o. Female) Zenaida Deed Primary Care Lesley Galentine: PA Zenovia Jordan, [place] Other Clinician: Referring Krzysztof Reichelt: Treating Haniah Penny/Extender: Valentino Saxon in Treatment: 0 Active Problems Location of Pain Severity and Description of Pain Patient Has Paino No Site Locations Rate the pain. Current Pain Level: 0 Pain Management and Medication Current Pain Management: Electronic Signature(s) Signed: 11/08/2019 4:42:48 PM By: Zenaida Deed RN, BSN Entered By: Zenaida Deed on 11/08/2019 14:29:03 -------------------------------------------------------------------------------- Patient/Caregiver Education Details Patient Name: Date of Service: Susan Mcgee NICE 6/18/2021andnbsp1:15 PM Medical Record Number: [id number] Patient Account Number: [id number] Date of Birth/Gender: Treating RN: 1941-04-30 (79 y.o. Female) Cherylin Mylar Primary Care Physician: PA Zenovia Jordan, [place] Other Clinician: Referring Physician: Treating Physician/Extender: Valentino Saxon in Treatment: 0 Education Assessment Education Provided To: Patient Education Topics Provided Pressure: Handouts: Pressure Ulcers: Care and Offloading Methods: Explain/Verbal Responses: State content correctly Wound/Skin Impairment: Handouts: Caring for Your Ulcer Methods: Explain/Verbal Responses: State content correctly Electronic Signature(s) Signed: 11/08/2019 5:07:15 PM By: Cherylin Mylar Entered By: Cherylin Mylar on 11/08/2019 14:51:25 -------------------------------------------------------------------------------- Wound Assessment Details Patient Name: Date of Service: Susan Mcgee NICE 11/08/2019 1:15 PM Medical Record Number: [id number] Patient Account Number: [id number] Date of Birth/Sex: Treating RN: Jun 22, 1940 (79 y.o. Female) Zenaida Deed Primary Care Niyla Marone: PA Zenovia Jordan, [place] Other Clinician: Referring Grabiel Schmutz: Treating  Halo Shevlin/Extender: Valentino Saxon in Treatment: 0 Wound Status Wound Number: 1 Primary Pressure Ulcer Etiology: Wound Location: Left Calcaneus Wound Status: Not Healed Wounding Event: Pressure Injury Comorbid Cataracts, Anemia, Hypertension, Type II Diabetes, Date Acquired: 10/22/2019 History: Neuropathy Weeks Of Treatment: 0 Clustered Wound: No Photos Wound Measurements Length: (cm) 3.8 Width: (cm) 4.3 Depth: (cm) 0.1 Area: (cm) 12.833 Volume: (cm) 1.283 % Reduction in Area: 0% % Reduction in Volume: 0% Epithelialization: Large (67-100%) Tunneling: No Undermining: No Wound Description Classification: Unstageable/Unclassified Wound Margin: Flat and Intact Exudate Amount: Small Exudate Type: Serosanguineous Exudate Color: red, brown Foul Odor After Cleansing: No Slough/Fibrino No Wound Bed Granulation Amount: None Present (0%) Exposed Structure Necrotic Amount: Large (67-100%) Fascia Exposed: No Necrotic Quality: Eschar Fat Layer (Subcutaneous Tissue) Exposed: Yes Tendon Exposed: No Muscle Exposed: No Joint Exposed: No Bone Exposed: No Treatment Notes Wound #1 (Left Calcaneus) 1. Cleanse With Wound Cleanser 3. Primary Dressing Applied Collegen AG Other primary dressing (specifiy in notes) 4. Secondary Dressing Dry Gauze Roll Gauze Heel Cup 5. Secured With Tape 7. Footwear/Offloading device applied Wedge shoe Notes primary dressing saline moisten gauze backing the collagen Ag. Explained the orders, dressings, frequency of change, and to follow up with Dr. Inez Catalina in [place]. Electronic Signature(s) Signed: 11/13/2019 4:25:30 PM By: Zenaida Deed RN, BSN Signed: 11/13/2019 5:20:20 PM By: Marlinda Mike Previous Signature: 11/08/2019 4:42:48 PM Version By: Zenaida Deed RN, BSN Entered By: Marlinda Mike on 11/13/2019 15:54:40 -------------------------------------------------------------------------------- Vitals Details Patient Name: Date of  Service: Susan Mcgee NICE 11/08/2019 1:15 PM Medical Record Number: [id number] Patient Account Number: [id number] Date of Birth/Sex: Treating RN: April 24, 1941 (79 y.o. Female) Zenaida Deed Primary Care Galit Urich: PA Zenovia Jordan, [place] Other Clinician: Referring Piya Mesch: Treating Aizik Reh/Extender: Valentino Saxon in Treatment: 0 Vital Signs Time Taken: 13:49 Temperature (F): 97.9 Height (in): 63 Pulse (bpm): 59 Source: Stated Respiratory Rate (breaths/min): 18 Weight (lbs): 141 Blood Pressure (mmHg): 130/66 Source: Stated Capillary Blood Glucose (mg/dl): 737 Body  Mass Index (BMI): 25 Reference Range: 80 - 120 mg / dl Notes glucose per pt report this am Electronic Signature(s) Signed: 11/08/2019 4:42:48 PM By: Baruch Gouty RN, BSN Entered By: Baruch Gouty on 11/08/2019 13:54:58

## 2019-11-11 ENCOUNTER — Encounter (HOSPITAL_BASED_OUTPATIENT_CLINIC_OR_DEPARTMENT_OTHER): Payer: Medicare Other | Admitting: Internal Medicine

## 2019-11-11 ENCOUNTER — Encounter: Payer: Self-pay | Admitting: Internal Medicine

## 2019-11-11 DIAGNOSIS — H4921 Sixth [abducent] nerve palsy, right eye: Secondary | ICD-10-CM | POA: Insufficient documentation

## 2019-11-12 DIAGNOSIS — S91302A Unspecified open wound, left foot, initial encounter: Secondary | ICD-10-CM

## 2019-11-12 NOTE — Assessment & Plan Note (Signed)
F/U with Dr Inez Catalina, [place]

## 2019-11-13 ENCOUNTER — Encounter: Payer: Self-pay | Admitting: Cardiothoracic Surgery

## 2019-11-13 ENCOUNTER — Encounter: Payer: Self-pay | Admitting: Family Medicine

## 2019-11-13 ENCOUNTER — Ambulatory Visit: Payer: Medicare Other | Admitting: Cardiothoracic Surgery

## 2019-11-13 ENCOUNTER — Other Ambulatory Visit: Payer: Self-pay

## 2019-11-13 VITALS — BP 108/65 | HR 51 | Temp 96.8°F | Resp 16 | Wt 140.0 lb

## 2019-11-13 DIAGNOSIS — L89620 Pressure ulcer of left heel, unstageable: Secondary | ICD-10-CM

## 2019-11-13 NOTE — Progress Notes (Unsigned)
New Admit, Wound Care  Left lateral Heel Pressure Injury, Unstageable  Left ABI was 0.88 Leg Measurements/Circumference: ankle is 18cm and calf is 24cm.   Left Lateral Heel: Measurements= 3.5x3.7x0.1 Wound Bed Description=90% soft tan/black and 10% black dry eschar. No drainage present.  Post-Debridement: MD debrided site with 9mm curette and measures 3.5x3.7x0.9 post debridement.  Treatment= cleansed site with anacept and gauze, applied Iodosorb, 4x4, heel cup, kerlix, and netting.   MD ordered MRI of left foot, along with Arterial Dopplers Bilateral (including ABIs and TBIs). Consult for surgeon to evaluate wound and appointment scheduled for 11/14/2019.  Follow-Up appointment next week and team will follow up with surgeon.      Patient ID: Susan Mcgee, female   DOB: April 01, 1941, 79 y.o.   MRN: [id number]  Chief Complaint  Patient presents with  . Wound Check    Referred By Dr. Asa Saunas Reason for Referral Left heel wound  HPI Location, Quality, Duration, Severity, Timing, Context, Modifying Factors, Associated Signs and Symptoms.  Susan Mcgee is a 79 y.o. female.  This patient is seen today at the request of Dr. Asa Saunas in [place].  She is a 79 year old woman with a history of diabetes mellitus and hypertension who just recently located to the area from [place].  Soon after arriving here she fell fracturing her hip and spent a considerable period of time at the [facility] undergoing rehabilitation.  The day after discharge from the [facility] the granddaughter noticed a large pressure ulcer on her left heel.  She was then seen by Dr. Asa Saunas in [place] where an extensive debridement of her heel was performed and this was treated with local wound care.  Because she lives here in the [place] area she was referred to [place] for further ongoing consideration.  The patient states that she spends most of her day out of bed.  She is able to walk with the assistance  of a walker.  Prior to this time she was very active.  She does have a history of diabetes and is currently also on insulin and oral regimens.  She has not established primary caregiver here but her granddaughter who is intimately involved with her care is seeking a primary caregiver at this time.   Past Medical History:  Diagnosis Date  . Diabetes mellitus without complication ([place])   . Hypertension     Past Surgical History:  Procedure Laterality Date  . CATARACT EXTRACTION    . HIP ARTHROPLASTY Left 10/03/2019   Procedure: ARTHROPLASTY BIPOLAR HIP (HEMIARTHROPLASTY);  Surgeon: Carole Civil, MD;  Location: [facility];  Service: Orthopedics;  Laterality: Left;  . TRACHEOSTOMY     when she was born  . TUBAL LIGATION      No family history on file.  Social History Social History   Tobacco Use  . Smoking status: Never Smoker  . Smokeless tobacco: Never Used  Substance Use Topics  . Alcohol use: Never  . Drug use: Never    No Known Allergies  Current Outpatient Medications  Medication Sig Dispense Refill  . acetaminophen (TYLENOL) 650 MG CR tablet Take 650 mg by mouth every 8 (eight) hours.    Marland Kitchen amLODipine (NORVASC) 10 MG tablet Take 1 tablet (10 mg total) by mouth daily. 30 tablet 0  . cyanocobalamin 1000 MCG tablet Take 2,000 mcg by mouth daily.    . Glucerna (GLUCERNA) LIQD Take 237 mLs by mouth 2 (two) times daily between meals.    . insulin detemir (LEVEMIR)  100 UNIT/ML injection Inject 5 Units into the skin 2 (two) times daily.    . Iron, Ferrous Sulfate, 325 (65 Fe) MG TABS Take 1 tablet by mouth daily. 30 tablet 0  . metoprolol succinate (TOPROL-XL) 100 MG 24 hr tablet Take 1 tablet (100 mg total) by mouth daily. Take with or immediately following a meal. 30 tablet 0  . NON FORMULARY Diet: _____ Regular,---X--- Liquid Regular ___X__ NAS, ____X___Consistent Carbohydrate, _______NPO _____Other    . ondansetron (ZOFRAN) 4 MG tablet Take 1 tablet (4 mg total) by  mouth every 8 (eight) hours as needed for nausea or vomiting. 20 tablet 0  . polyethylene glycol (MIRALAX / GLYCOLAX) 17 g packet Take 17 g by mouth daily.    . sitaGLIPtin (JANUVIA) 50 MG tablet Take 1 tablet (50 mg total) by mouth daily. 30 tablet 0  . tamsulosin (FLOMAX) 0.4 MG CAPS capsule Take 1 capsule (0.4 mg total) by mouth daily after supper. 30 capsule 0   No current facility-administered medications for this visit.      Review of Systems A complete review of systems was asked and was negative except for the following positive findings recent fractured hip resulting in immobility  Blood pressure 108/65, pulse (!) 51, temperature (!) 96.8 F (36 C), temperature source Other (Comment), resp. rate 16, weight 140 lb (63.5 kg), SpO2 98 %.  Physical Exam  She is a very pleasant well-developed well-nourished woman who appears her stated age.  Her lower extremities are warm to the touch.  There are no palpable pulses bilaterally.  There is no other wounds present on her lower extremities.  On the left heel there is a large eschar.  There is some softer tissues deep to the eschar which can be felt with palpation.  There is no erythema or drainage from the wound.  I attempted debridement of the wound today.  We did debride the surface of the eschar but the depth was too great to be removed here in the office.  In addition there is a large amount necrotic subcutaneous tissue which cannot be removed here in the office setting.  I used a scissors and forceps as well as a curette.  There was some small punctate bleeding around the edges of the wound which were controlled with pressure.   Data Reviewed Records from [facility] wound care center  I have personally reviewed the patient's imaging, laboratory findings and medical records.    Assessment    Pressure ulcer left heel    Plan    I would like to get the consultation with one of our general surgeons to see if a more aggressive  debridement in the operating room can be performed.  If we are able to get back to reasonably good tissue I think a wound VAC may be helpful.  I will also check her lower extremity Dopplers and we will get an MRI of her foot to assess whether or not there is any osteomyelitis.  She will see Dr. Lovell Sheehan at [facility] surgical tomorrow.  In the interim we will use Iodosorb on her foot and we will see her back again after Dr. Lovell Sheehan evaluation.       Hulda Marin, MD 11/13/2019, 10:41 AM

## 2019-11-14 ENCOUNTER — Ambulatory Visit (INDEPENDENT_AMBULATORY_CARE_PROVIDER_SITE_OTHER): Payer: Medicare Other | Admitting: General Surgery

## 2019-11-14 ENCOUNTER — Other Ambulatory Visit: Payer: Self-pay

## 2019-11-14 ENCOUNTER — Encounter: Payer: Self-pay | Admitting: General Surgery

## 2019-11-14 VITALS — BP 102/57 | HR 57 | Temp 98.0°F | Resp 16

## 2019-11-14 DIAGNOSIS — L89626 Pressure-induced deep tissue damage of left heel: Secondary | ICD-10-CM

## 2019-11-15 NOTE — Progress Notes (Signed)
Coda Filler; [id number]; 08/16/1940   HPI Patient is a 79 year old white female who was referred to my care by Dr. Nestor Lewandowsky for evaluation treatment of a pressure ulceration of the left heel.  Patient suffered a hip fracture and was in a nursing home for some time.  She now was at home and she was noticed on discharge to have a left heel wound.  Her daughter is taking care of her.  Wound care has been doing dressing changes at home.  She was seen by Dr. Genevive Bi on 11/13/2019 and underwent limited debridement.  She was referred to my care for possible further debridement in the operating room.  She is somewhat ambulating with a cane, but is also wheelchair-bound.  She denies any pain. Past Medical History:  Diagnosis Date  . Diabetes mellitus without complication ([place])   . Hypertension     Past Surgical History:  Procedure Laterality Date  . CATARACT EXTRACTION    . HIP ARTHROPLASTY Left 10/03/2019   Procedure: ARTHROPLASTY BIPOLAR HIP (HEMIARTHROPLASTY);  Surgeon: Carole Civil, MD;  Location: [facility];  Service: Orthopedics;  Laterality: Left;  . TRACHEOSTOMY     when she was born  . TUBAL LIGATION      History reviewed. No pertinent family history.  Current Outpatient Medications on File Prior to Visit  Medication Sig Dispense Refill  . acetaminophen (TYLENOL) 650 MG CR tablet Take 650 mg by mouth every 8 (eight) hours.    Marland Kitchen amLODipine (NORVASC) 10 MG tablet Take 1 tablet (10 mg total) by mouth daily. 30 tablet 0  . cyanocobalamin 1000 MCG tablet Take 2,000 mcg by mouth daily.    . Glucerna (GLUCERNA) LIQD Take 237 mLs by mouth 2 (two) times daily between meals.    . insulin detemir (LEVEMIR) 100 UNIT/ML injection Inject 5 Units into the skin 2 (two) times daily.    . Iron, Ferrous Sulfate, 325 (65 Fe) MG TABS Take 1 tablet by mouth daily. 30 tablet 0  . metoprolol succinate (TOPROL-XL) 100 MG 24 hr tablet Take 1 tablet (100 mg total) by mouth daily. Take with or immediately  following a meal. 30 tablet 0  . NON FORMULARY Diet: _____ Regular,---X--- Liquid Regular ___X__ NAS, ____X___Consistent Carbohydrate, _______NPO _____Other    . ondansetron (ZOFRAN) 4 MG tablet Take 1 tablet (4 mg total) by mouth every 8 (eight) hours as needed for nausea or vomiting. 20 tablet 0  . polyethylene glycol (MIRALAX / GLYCOLAX) 17 g packet Take 17 g by mouth daily.    . sitaGLIPtin (JANUVIA) 50 MG tablet Take 1 tablet (50 mg total) by mouth daily. 30 tablet 0  . tamsulosin (FLOMAX) 0.4 MG CAPS capsule Take 1 capsule (0.4 mg total) by mouth daily after supper. 30 capsule 0  . [DISCONTINUED] enoxaparin (LOVENOX) 40 MG/0.4ML injection Inject 0.4 mLs (40 mg total) into the skin daily for 6 days. For 28 days 2.5 mL 0   No current facility-administered medications on file prior to visit.    No Known Allergies  Social History   Substance and Sexual Activity  Alcohol Use Never    Social History   Tobacco Use  Smoking Status Never Smoker  Smokeless Tobacco Never Used    Review of Systems  Constitutional: Negative.   HENT: Negative.   Eyes: Positive for blurred vision and double vision.  Respiratory: Negative.   Cardiovascular: Negative.   Gastrointestinal: Negative.   Genitourinary: Negative.   Musculoskeletal: Negative.   Skin: Negative.  Neurological: Negative.   Endo/Heme/Allergies: Negative.   Psychiatric/Behavioral: Negative.     Objective   Vitals:   11/14/19 1139  BP: (!) 102/57  Pulse: (!) 57  Resp: 16  Temp: 98 F (36.7 C)  SpO2: 98%    Physical Exam Vitals reviewed.  Constitutional:      Appearance: Normal appearance. She is normal weight. She is not ill-appearing.  HENT:     Head: Normocephalic and atraumatic.  Cardiovascular:     Rate and Rhythm: Normal rate and regular rhythm.     Heart sounds: No murmur heard.  Friction rub present. No gallop.   Pulmonary:     Effort: Pulmonary effort is normal. No respiratory distress.      Breath sounds: Normal breath sounds. No stridor. No wheezing, rhonchi or rales.  Musculoskeletal:     Comments: Left heel with unstageable heel ulceration.  No bone is exposed at this time.  Some necrotic tissue is present subcutaneously.  I could not feel dorsalis pedis or posterior tibial pulse.  I could barely feel a left femoral pulse.  Skin is thin but not cyanotic.  No toe ulcerations are present.  Skin:    General: Skin is warm and dry.  Neurological:     Mental Status: She is alert and oriented to person, place, and time.     Assessment  Pressure ulceration of left heel, not stage but at this time. Plan   Before proceeding with more extensive debridement of the wound, I agree that MRI of the left foot as well as arterial segmental Dopplers need to be performed.  These have been ordered.  Patient will continue with home health wound care and will be seen by Dr. Thelma Barge next week.  Further management is pending those results.

## 2019-11-20 ENCOUNTER — Ambulatory Visit (INDEPENDENT_AMBULATORY_CARE_PROVIDER_SITE_OTHER): Payer: Medicare Other | Admitting: Cardiothoracic Surgery

## 2019-11-20 ENCOUNTER — Other Ambulatory Visit: Payer: Self-pay

## 2019-11-20 ENCOUNTER — Encounter: Payer: Self-pay | Admitting: Cardiothoracic Surgery

## 2019-11-20 ENCOUNTER — Encounter: Payer: Self-pay | Admitting: Family Medicine

## 2019-11-20 VITALS — BP 114/66 | HR 60 | Temp 97.5°F | Resp 14 | Ht 63.0 in | Wt 135.0 lb

## 2019-11-20 DIAGNOSIS — L89626 Pressure-induced deep tissue damage of left heel: Secondary | ICD-10-CM | POA: Diagnosis not present

## 2019-11-20 DIAGNOSIS — L89620 Pressure ulcer of left heel, unstageable: Secondary | ICD-10-CM

## 2019-11-20 NOTE — Progress Notes (Signed)
Wound Care Follow-Up  Left lateral Heel Pressure Injury, Unstageable  Left ABI was 0.88 Leg Measurements/Circumference: ankle is 18cm and calf is 24cm.   Left Lateral Heel: Measurements= 3.5x3.7x0, no change in measurements. Wound Bed Description=100% dry/black eschar. Treatment= cleansed site with anacept and gauze, painted with betadine, heel cup, kerlix, and netting applied.  Arterial Studies and MRI are scheduled for 12/02/2019  Follow-Up appointment two weeks  [facility] will continue to change dressing 3x a week, orders will be faxed.   She returns today in follow-up.  She did see Dr. Lovell Sheehan in consultation.  At the present time we are awaiting the results of her MRI and her lower extremity vascular studies to be completed.  She states that she has been changing the dressing every other day.  They have been using Iodosorb on this.  There have been no further issues.  Today on exam the wound is very dry.  There is no discharge from the wound.  There is no surrounding erythema.  The skin surrounding the eschar appears healthy.  I would recommend that we clean the wound daily with Betadine solution.  I explained this to her granddaughter who is very involved in her care.  We can do this as often as the patient takes a bath or shower.  I did tell her that I did not want the wound to be excessively wet.  The drier this wound can be the better will heal.  She understands.  We will continue to follow her and see her back again in 2 weeks.

## 2019-11-21 ENCOUNTER — Other Ambulatory Visit: Payer: Self-pay | Admitting: Adult Health

## 2019-11-29 ENCOUNTER — Other Ambulatory Visit: Payer: Self-pay

## 2019-11-29 ENCOUNTER — Ambulatory Visit (INDEPENDENT_AMBULATORY_CARE_PROVIDER_SITE_OTHER): Payer: Medicare Other | Admitting: Orthopedic Surgery

## 2019-11-29 ENCOUNTER — Ambulatory Visit: Payer: Medicare Other

## 2019-11-29 ENCOUNTER — Encounter: Payer: Self-pay | Admitting: Orthopedic Surgery

## 2019-11-29 ENCOUNTER — Telehealth: Payer: Self-pay | Admitting: Cardiothoracic Surgery

## 2019-11-29 VITALS — BP 132/52 | HR 55 | Ht 63.0 in | Wt 133.2 lb

## 2019-11-29 DIAGNOSIS — S72002D Fracture of unspecified part of neck of left femur, subsequent encounter for closed fracture with routine healing: Secondary | ICD-10-CM

## 2019-11-29 DIAGNOSIS — Z96642 Presence of left artificial hip joint: Secondary | ICD-10-CM

## 2019-11-29 NOTE — Telephone Encounter (Signed)
Ethelene Browns with [facility] Scheduling called, he said that the ultrasound that was scheduled for July 12th cancelled.  This needs to be rescheduled to [facility] at [phone] and also that order needs to be changed to cpt code 53005.  Thank you.

## 2019-11-29 NOTE — Progress Notes (Signed)
Chief Complaint  Patient presents with  . Follow-up    lt hip surgery 10/03/2019   Chief Complaint  Patient presents with  . Routine Post Op    10/03/19 left hip surgery / bipolar replacement   . Wound Check    incision / staples steristrips in place, no redness drainage or irritation noted    she continues to do well   Hip flexion is normal leg lengths are normal   Encounter Diagnoses  Name Primary?  . 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 Closed fracture of left hip with routine healing, subsequent encounter     Fu 6 weeks

## 2019-12-02 ENCOUNTER — Encounter (HOSPITAL_COMMUNITY): Payer: Self-pay | Admitting: *Deleted

## 2019-12-02 ENCOUNTER — Ambulatory Visit (HOSPITAL_COMMUNITY): Payer: Medicare Other

## 2019-12-02 ENCOUNTER — Ambulatory Visit (HOSPITAL_COMMUNITY)
Admission: RE | Admit: 2019-12-02 | Discharge: 2019-12-02 | Disposition: A | Discharge status: Home / Self Care | Payer: Medicare Other | Source: Ambulatory Visit | Attending: Cardiothoracic Surgery | Admitting: Cardiothoracic Surgery

## 2019-12-02 ENCOUNTER — Other Ambulatory Visit: Payer: Self-pay

## 2019-12-02 DIAGNOSIS — L89620 Pressure ulcer of left heel, unstageable: Secondary | ICD-10-CM

## 2019-12-02 MED ORDER — GADOBUTROL 1 MMOL/ML IV SOLN
6.0000 mL | Freq: Once | INTRAVENOUS | Status: AC | PRN
  Administered 2019-12-02: 6 mL via INTRAVENOUS

## 2019-12-02 NOTE — Telephone Encounter (Signed)
Called and spoke to Welch inquiring why the [place] seg was cancelled. He states that it was because there was scheduled maintenance for that department and they could not use the Ultrasound facility. Kelli called [facility] radiology and spoke to [facility] about this and she states they can do this at [facility] on 12/06/2019 @ 10am. Someone was in the chart but Ethelene Browns assured me that he would get that schedule and I informed him I would call the patient to let the granddaughter know about the appointment.   Of side note the CPT code that was given in this note is not an active CPT code.

## 2019-12-03 NOTE — Telephone Encounter (Signed)
Pt's granddaughter aware of apt at [facility].

## 2019-12-04 ENCOUNTER — Other Ambulatory Visit: Payer: Self-pay

## 2019-12-04 ENCOUNTER — Encounter: Payer: Self-pay | Admitting: Cardiothoracic Surgery

## 2019-12-04 ENCOUNTER — Ambulatory Visit (INDEPENDENT_AMBULATORY_CARE_PROVIDER_SITE_OTHER): Payer: Medicare Other | Admitting: Cardiothoracic Surgery

## 2019-12-04 VITALS — BP 138/71 | HR 55 | Temp 98.3°F | Resp 12 | Ht 63.0 in | Wt 134.0 lb

## 2019-12-04 DIAGNOSIS — L89626 Pressure-induced deep tissue damage of left heel: Secondary | ICD-10-CM

## 2019-12-04 NOTE — Progress Notes (Signed)
Wound Care Follow-Up  Left lateral Heel Pressure Injury, Unstageable  Leg Measurements/Circumference: ankle is 18cm and calf is 23cm.   Left Lateral Heel: Measurements= 3.5x2.9x0, smaller in width. Wound Bed Description=100% dry/black eschar. Treatment= cleansed site with anacept and gauze, painted with betadine, heel cup, kerlix, and netting applied.  [facility] will continue to change dressing 3x a week.   Granddaughter stated that patient will be moving back to [place] and is planning on getting the patient in a Wound Care Clinic there. She will call us if the patient returns.   She returns today in follow-up.  She has no new complaints.  The only issue is that she would like to return back to [place].  Whether or not this will be a long-term move has yet to be determined.  She is having her wounds dressed 3 times per week.  They have been using she is having her wounds dressed 3 times per week.  They have been using Betadine solution on the wound followed by heel protector.  There is been no significant issues with her wound care here.  She did have an MRI done which did not show any evidence of osteomyelitis.  Today her wounds are measured as outlined above.  There is slight decrement in the overall size of the wound.  There is no fluctuance, erythema or discharge.  The wound is quite dry.  She is scheduled to have her lower extremity duplex studies performed later this week.  We will see her back again in 1 week but will be happy to arrange any transfer of care that might need to be completed.

## 2019-12-06 ENCOUNTER — Other Ambulatory Visit: Payer: Self-pay

## 2019-12-06 ENCOUNTER — Ambulatory Visit (HOSPITAL_COMMUNITY)
Admission: RE | Admit: 2019-12-06 | Discharge: 2019-12-06 | Disposition: A | Discharge status: Home / Self Care | Payer: Medicare Other | Source: Ambulatory Visit | Attending: Cardiothoracic Surgery | Admitting: Cardiothoracic Surgery

## 2019-12-06 DIAGNOSIS — L89620 Pressure ulcer of left heel, unstageable: Secondary | ICD-10-CM | POA: Insufficient documentation

## 2020-01-13 ENCOUNTER — Encounter: Payer: Self-pay | Admitting: Orthopedic Surgery

## 2020-01-13 ENCOUNTER — Ambulatory Visit: Payer: Medicare Other | Admitting: Orthopedic Surgery

## 2022-02-23 IMAGING — CT CT HEAD W/O CM
3 of 4 series · 16 of 47 positions shown, 19 images · non-contrast
Comparison: None.

CLINICAL DATA: Fall last night.

EXAM:
CT HEAD WITHOUT CONTRAST
TECHNIQUE: Contiguous axial images were obtained from the base of the skull
through the vertex without intravenous contrast.

[Series 2: head w o · axial · 0.41mm/px · z∈[+8,+128]mm · 10 of 30 slices shown, 13 images]
[im 3/30  brain]
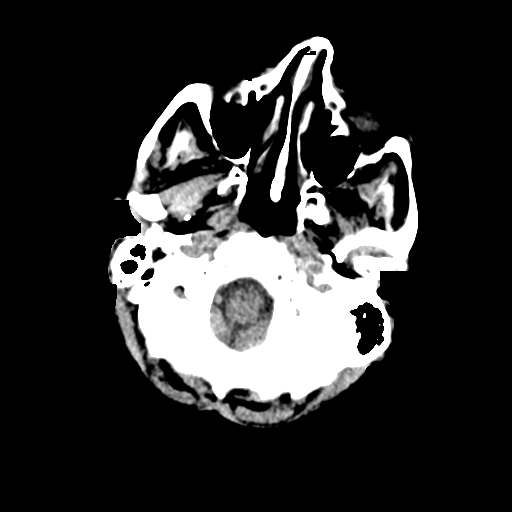
[im 3/30  bone]
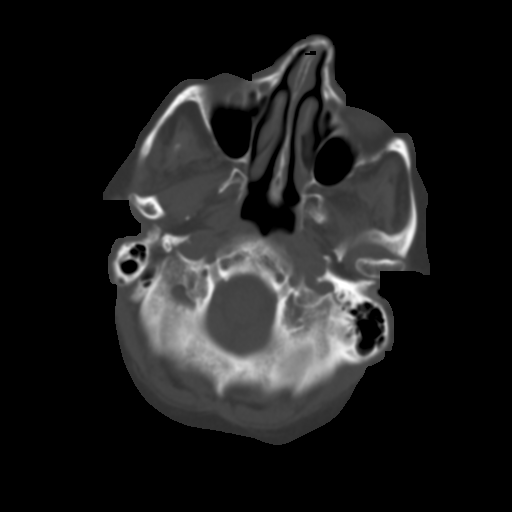
[im 5/30  brain]
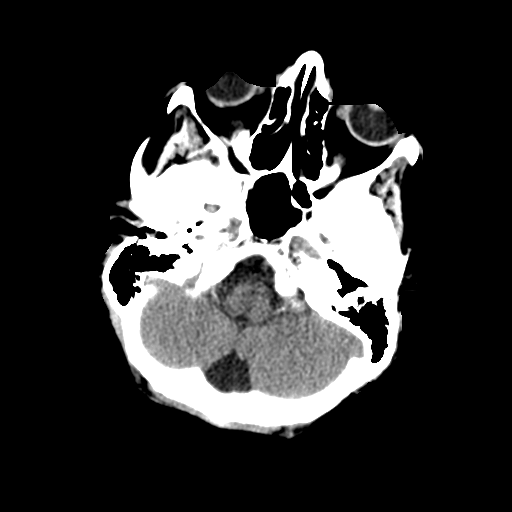
[im 9/30  brain]
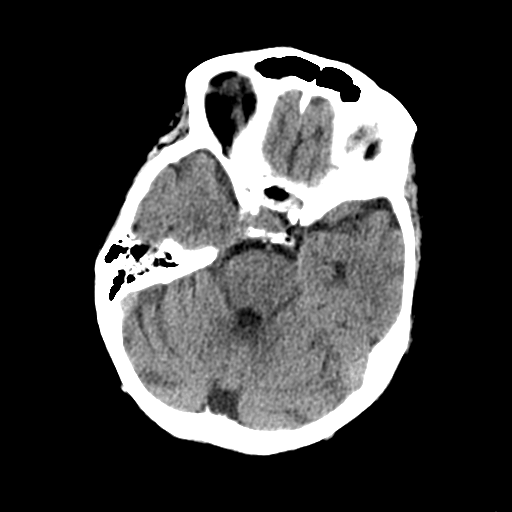
[im 11/30  brain]
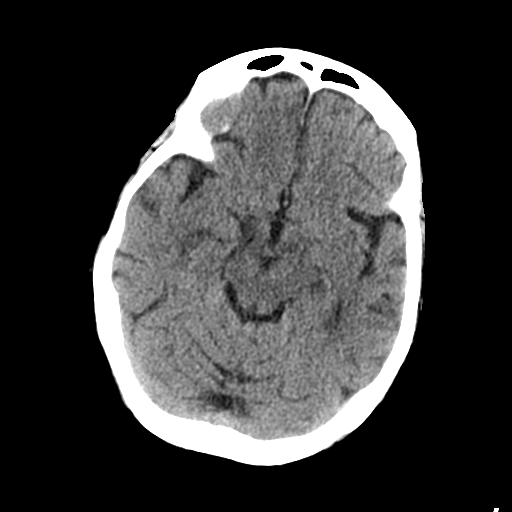
[im 13/30  brain]
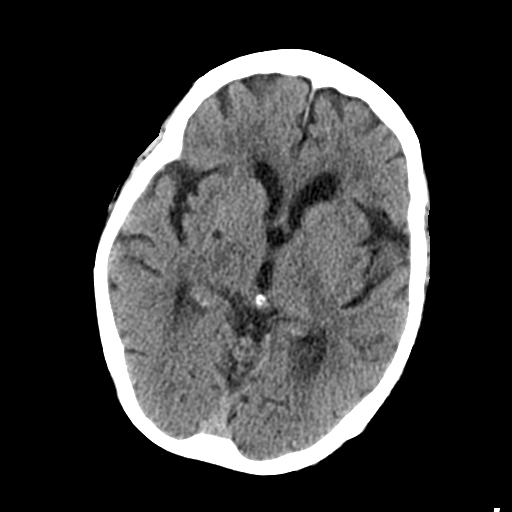
[im 13/30  bone]
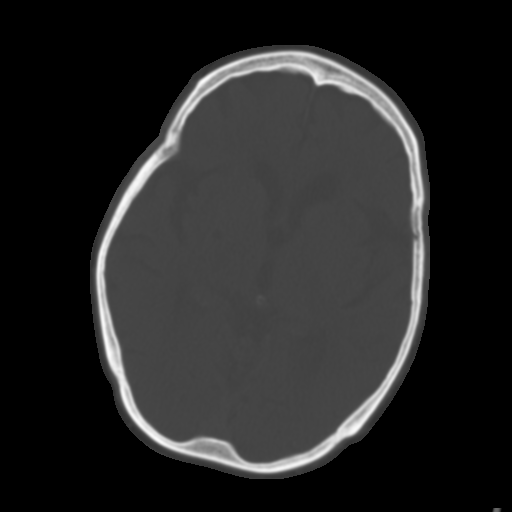
[im 17/30  brain]
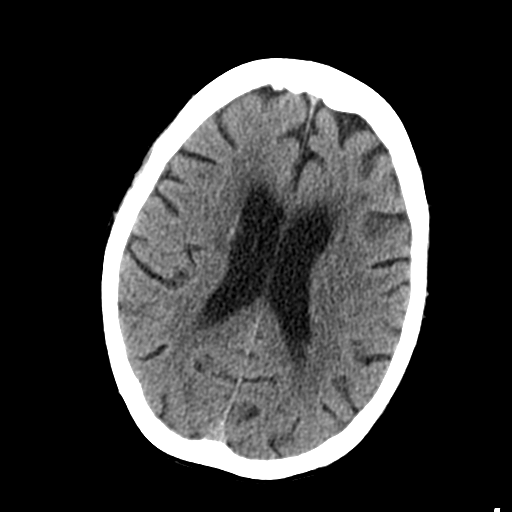
[im 19/30  brain]
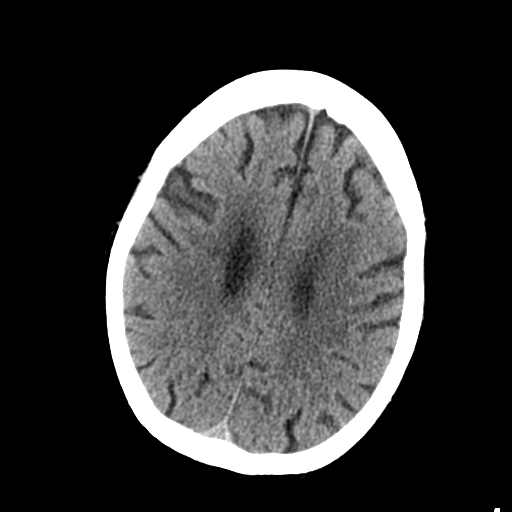
[im 21/30  brain]
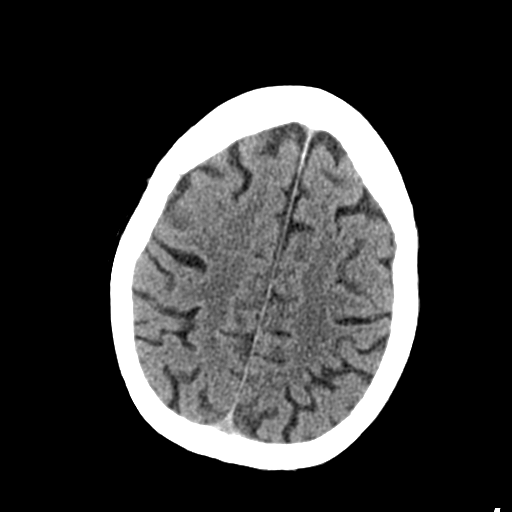
[im 25/30  brain]
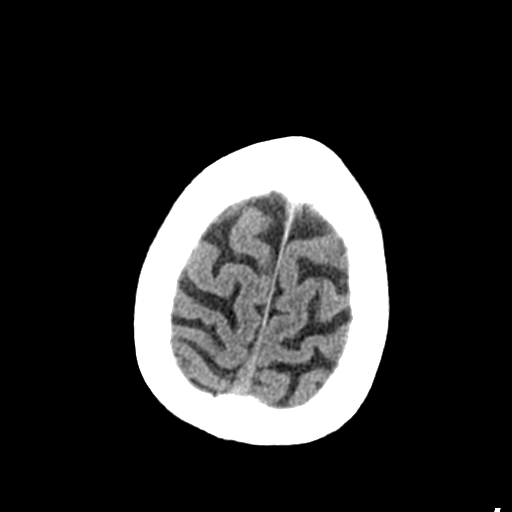
[im 25/30  bone]
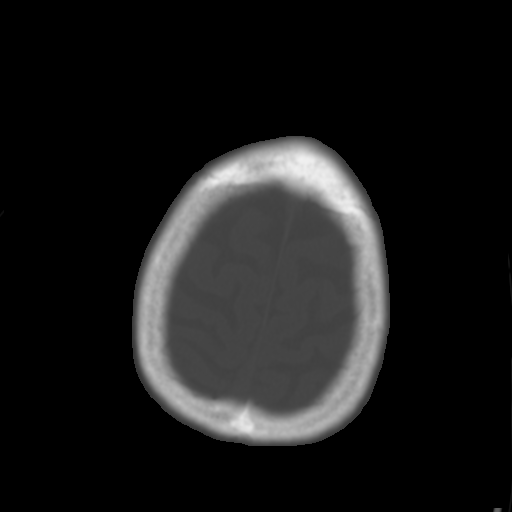
[im 27/30  brain]
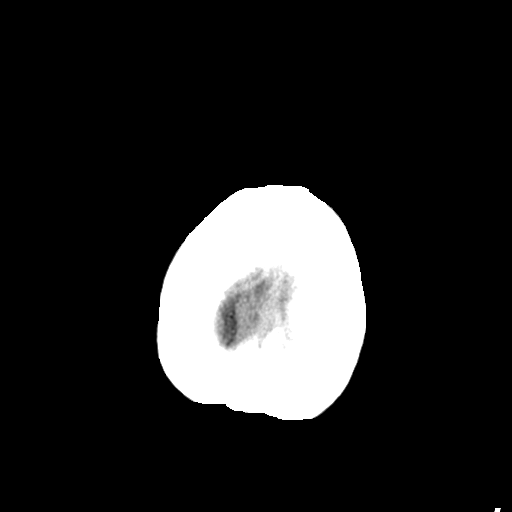

[Series 4: coronal soft · coronal · 0.30mm/px · 3 of 63 slices shown]
[im 21/63  brain]
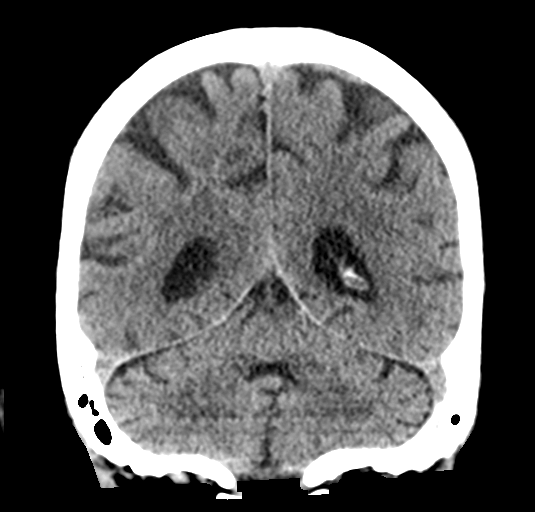
[im 28/63  brain]
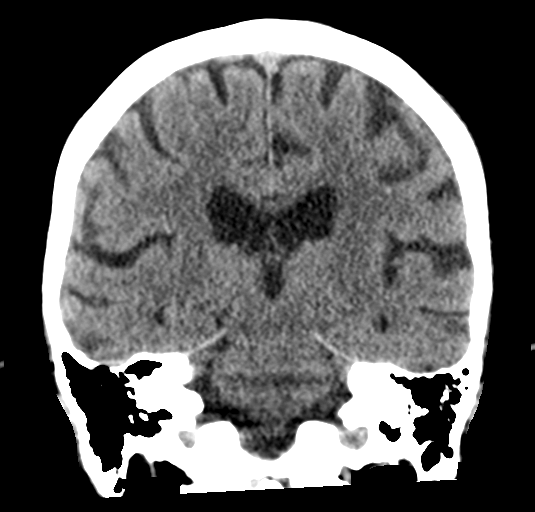
[im 35/63  brain]
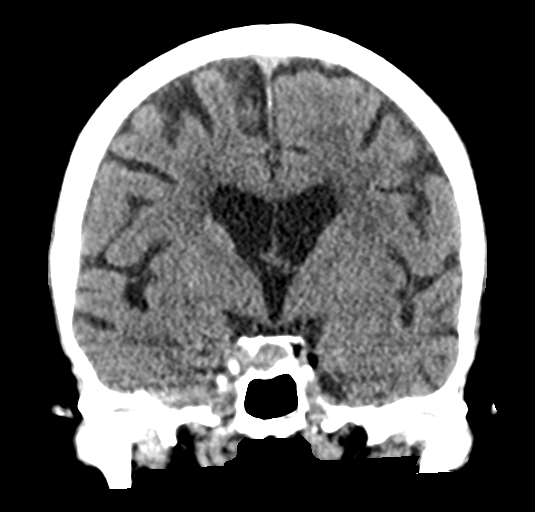

[Series 5: sagittal soft · sagittal · 0.30mm/px · 3 of 52 slices shown]
[im 18/52  brain]
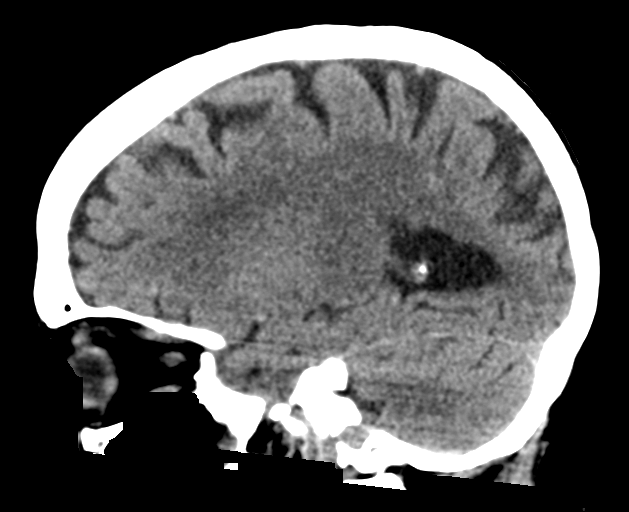
[im 26/52  brain]
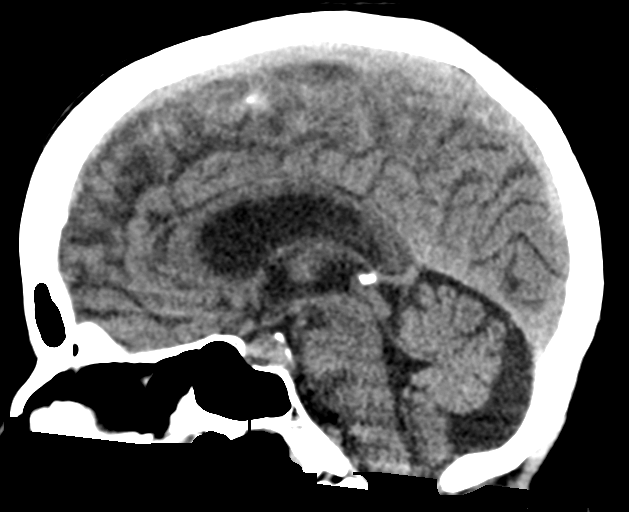
[im 35/52  brain]
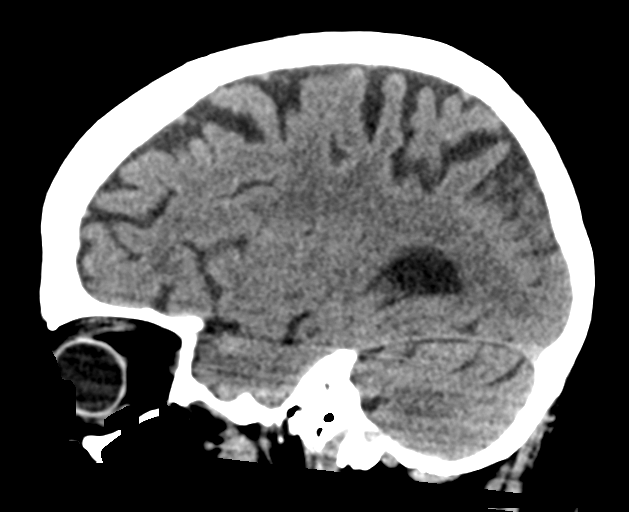

[16 of 47 positions shown; findings below may reference images not displayed]

FINDINGS: Brain: No evidence of acute infarction, hemorrhage, hydrocephalus,
extra-axial collection or mass lesion/mass effect. Small chronic
lacunar infarct in the right basal ganglia. Mild-to-moderate
generalized cerebral atrophy. Scattered mild periventricular and
subcortical white matter hypodensities are nonspecific, but favored
to reflect chronic microvascular ischemic changes.

Vascular: Atherosclerotic vascular calcification of the carotid
siphons. No hyperdense vessel.

Skull: Normal. Negative for fracture or focal lesion.

Sinuses/Orbits: No acute finding.

Other: None.
IMPRESSION: 1. No acute intracranial abnormality.
2. Atrophy and chronic microvascular ischemic changes. Old lacunar
infarct in the right basal ganglia.
